# Patient Record
Sex: Female | Born: 1995
Health system: Southern US, Community
[De-identification: ages and names within clinical notes are randomized; demographics above are authoritative.]

## PROBLEM LIST (undated history)

## (undated) DIAGNOSIS — G43909 Migraine, unspecified, not intractable, without status migrainosus: Secondary | ICD-10-CM

---
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
## 2016-01-28 MED FILL — SUMATRIPTAN SUCC 50 MG TAB: 50 | 30 days supply | Qty: 9 | Fill #3

## 2016-01-30 ENCOUNTER — Emergency Department (INDEPENDENT_AMBULATORY_CARE_PROVIDER_SITE_OTHER): Payer: 59

## 2016-01-30 ENCOUNTER — Emergency Department
Admission: EM | Admit: 2016-01-30 | Discharge: 2016-01-30 | Disposition: A | Discharge status: Home / Self Care | Payer: 59 | Source: Home / Self Care | Attending: Family Medicine | Admitting: Family Medicine

## 2016-01-30 ENCOUNTER — Encounter: Payer: Self-pay | Admitting: Emergency Medicine

## 2016-01-30 DIAGNOSIS — M25531 Pain in right wrist: Secondary | ICD-10-CM

## 2016-01-30 DIAGNOSIS — W19XXXA Unspecified fall, initial encounter: Secondary | ICD-10-CM | POA: Diagnosis not present

## 2016-01-30 DIAGNOSIS — S63501A Unspecified sprain of right wrist, initial encounter: Secondary | ICD-10-CM | POA: Diagnosis not present

## 2016-01-30 DIAGNOSIS — S6991XA Unspecified injury of right wrist, hand and finger(s), initial encounter: Secondary | ICD-10-CM | POA: Diagnosis not present

## 2016-01-30 NOTE — Discharge Instructions (Signed)
Apply ice pack for 30 minutes every 2 to 3 hours today and tomorrow.  Elevate. Wear brace for about 10 to 14 days.  Begin range of motion and stretching exercises as tolerated.  For pain may take Ibuprofen 200mg , 3 or 4 tabs every 8 hours with food.

## 2016-01-30 NOTE — ED Notes (Signed)
Right wrist injury, fell down steps today

## 2016-01-30 NOTE — ED Provider Notes (Signed)
CSN: [id number]     Arrival date & time 01/30/16  1502 History   First MD Initiated Contact with Patient 01/30/16 1608     Chief Complaint  Patient presents with  . Wrist Injury      HPI Comments: Patient fell down several steps two hours ago, bracing herself with her right hand/wrist.  She complains of pain in her right wrist.  Patient is a 20 y.o. female presenting with wrist pain. The history is provided by the patient.  Wrist Pain This is a new problem. Episode onset: 2 hours ago. The problem occurs constantly. The problem has not changed since onset.Associated symptoms comments: None . Exacerbated by: right wrist movement. Nothing relieves the symptoms. She has tried nothing for the symptoms.    Past Medical History  Diagnosis Date  . Migraines    History reviewed. No pertinent past surgical history. Family History  Problem Relation Age of Onset  . Hyperlipidemia Mother   . Hypertension Mother   . Diabetes      grandfather   . Skin cancer      grandfather    Social History  Substance Use Topics  . Smoking status: Current Every Day Smoker -- 1.00 packs/day    Types: Cigarettes  . Smokeless tobacco: Never Used  . Alcohol Use: No   OB History    No data available     Review of Systems  All other systems reviewed and are negative.   Allergies  Review of patient's allergies indicates no known allergies.  Home Medications   Prior to Admission medications   Medication Sig Start Date End Date Taking? Authorizing Provider  escitalopram (LEXAPRO) 10 MG tablet Take 1 tablet (10 mg total) by mouth daily. 04/03/15   Laren BoomSean Hommel, DO  hydrOXYzine (ATARAX/VISTARIL) 25 MG tablet Take 1 tablet (25 mg total) by mouth 2 (two) times daily as needed for anxiety. 04/03/15   Sean Hommel, DO  pantoprazole (PROTONIX) 40 MG tablet Take 1 tablet (40 mg total) by mouth daily. May stop in March 2016 11/12/14   Laren BoomSean Hommel, DO  SUMAtriptan (IMITREX) 50 MG tablet Take 1 tablet (50 mg total)  by mouth every 2 (two) hours as needed for migraine or headache. 09/24/15   Laren BoomSean Hommel, DO   Meds Ordered and Administered this Visit  Medications - No data to display  BP 123/84 mmHg  Pulse 88  Temp(Src) 98.4 F (36.9 C) (Oral)  Ht 5\' 3"  (1.6 m)  Wt 141 lb (63.957 kg)  BMI 24.98 kg/m2  SpO2 96%  LMP 01/24/2016 No data found.   Physical Exam  Constitutional: She is oriented to person, place, and time. She appears well-developed and well-nourished. No distress.  HENT:  Head: Atraumatic.  Eyes: Conjunctivae are normal. Pupils are equal, round, and reactive to light.  Neck: Normal range of motion.  Pulmonary/Chest: Effort normal.  Musculoskeletal:       Right wrist: She exhibits decreased range of motion, tenderness and bony tenderness. She exhibits no swelling, no effusion, no crepitus, no deformity and no laceration.       Hands: Right wrist has tenderness to palpation on there ulnar aspect as noted on diagram.      Neurological: She is alert and oriented to person, place, and time.  Skin: Skin is warm and dry.  Nursing note and vitals reviewed.   ED Course  Procedures none   Imaging Review Dg Wrist Complete Right  01/30/2016  CLINICAL DATA:  Pain following fall EXAM:  RIGHT WRIST - COMPLETE 3+ VIEW COMPARISON:  None. FINDINGS: Frontal, oblique, lateral, and ulnar deviation scaphoid images were obtained. There is no appreciable fracture or dislocation. The joint spaces appear normal. No erosive change. IMPRESSION: No fracture or dislocation.  No appreciable arthropathy. Electronically Signed   By: Bretta BangWilliam  Woodruff III M.D.   On: 01/30/2016 16:10      MDM   1. Sprain of right wrist, initial encounter    Concern for possible TFCC injury.  Wrist splint applied. Apply ice pack for 30 minutes every 2 to 3 hours today and tomorrow.  Elevate. Wear brace for about 10 to 14 days.  Begin range of motion and stretching exercises as tolerated.  For pain may take Ibuprofen 200mg , 3  or 4 tabs every 8 hours with food.  Followup with Dr. Rodney Langtonhomas Thekkekandam or Dr. Clementeen GrahamEvan Corey ([facility]) if not improving about two weeks.     Lattie HawStephen A Beese, MD 02/01/16 [phone]

## 2016-02-24 ENCOUNTER — Encounter: Payer: 59 | Admitting: Obstetrics & Gynecology

## 2016-03-24 MED FILL — SUMATRIPTAN SUCC 50 MG TAB: 50 | 30 days supply | Qty: 9 | Fill #4

## 2016-04-20 MED FILL — SUMATRIPTAN SUCC 50 MG TAB: 50 | 30 days supply | Qty: 9 | Fill #5

## 2016-06-04 MED FILL — SUMATRIPTAN SUCC 50 MG TAB: 50 | 30 days supply | Qty: 9 | Fill #6

## 2016-06-09 ENCOUNTER — Ambulatory Visit (INDEPENDENT_AMBULATORY_CARE_PROVIDER_SITE_OTHER): Payer: 59 | Admitting: Physician Assistant

## 2016-06-09 ENCOUNTER — Encounter: Payer: Self-pay | Admitting: Physician Assistant

## 2016-06-09 VITALS — BP 116/57 | HR 84 | Ht 63.0 in | Wt 149.0 lb

## 2016-06-09 DIAGNOSIS — Z7251 High risk heterosexual behavior: Secondary | ICD-10-CM | POA: Diagnosis not present

## 2016-06-09 DIAGNOSIS — R319 Hematuria, unspecified: Secondary | ICD-10-CM | POA: Diagnosis not present

## 2016-06-09 DIAGNOSIS — N3001 Acute cystitis with hematuria: Secondary | ICD-10-CM

## 2016-06-09 DIAGNOSIS — R3915 Urgency of urination: Secondary | ICD-10-CM

## 2016-06-09 DIAGNOSIS — R8290 Unspecified abnormal findings in urine: Secondary | ICD-10-CM

## 2016-06-09 DIAGNOSIS — R300 Dysuria: Secondary | ICD-10-CM

## 2016-06-09 LAB — POCT URINALYSIS DIPSTICK
BILIRUBIN UA: NEGATIVE
GLUCOSE UA: NEGATIVE
KETONES UA: NEGATIVE
Nitrite, UA: NEGATIVE
Spec Grav, UA: 1.02
Urobilinogen, UA: 0.2
pH, UA: 7

## 2016-06-09 LAB — POCT URINE PREGNANCY: Preg Test, Ur: NEGATIVE

## 2016-06-09 MED ORDER — CIPROFLOXACIN HCL 500 MG PO TABS: 500.0000 mg | ORAL_TABLET | Freq: Two times a day (BID) | ORAL | 0 refills | Status: DC

## 2016-06-09 MED ORDER — ETONOGESTREL-ETHINYL ESTRADIOL 0.12-0.015 MG/24HR VA RING: VAGINAL_RING | VAGINAL | 12 refills | Status: DC

## 2016-06-09 MED FILL — CIPROFLOXACIN HCL 500 MG TA: 500 | 7 days supply | Qty: 14 | Fill #0

## 2016-06-09 MED FILL — NUVARING VAGINAL RING: 0.12-0.015 | 84 days supply | Qty: 3 | Fill #0

## 2016-06-09 NOTE — Patient Instructions (Signed)

## 2016-06-09 NOTE — Progress Notes (Signed)
   Subjective:    Patient ID: Mckenzie Stevens, female    DOB: 09-24-1995, 20 y.o.   MRN: [id number]  HPI Pt is a 20 yo female who presents to the clinic with dysuria, urgency, abnormal urine odor for 7 days. She is having some right sided episodic shooting pain. No fever or chills. At times she feels a little nauseated. No abdominal pain. She has not tried anything to make better. No vaginal discharge. She is not using anything to prevent pregnancy or protect against STD.    Review of Systems  All other systems reviewed and are negative.      Objective:   Physical Exam  Constitutional: She is oriented to person, place, and time. She appears well-developed and well-nourished.  HENT:  Head: Normocephalic and atraumatic.  Cardiovascular: Normal rate, regular rhythm and normal heart sounds.   Pulmonary/Chest: Effort normal and breath sounds normal.  No CVA tenderness.   Abdominal: Soft. Bowel sounds are normal. She exhibits no distension and no mass. There is no tenderness. There is no rebound and no guarding.  Neurological: She is alert and oriented to person, place, and time.  Psychiatric: She has a normal mood and affect. Her behavior is normal.          Assessment & Plan:  .Marland Kitchen.Mckenzie Stevens was seen today for dysuria and abnormal urine odor.  Diagnoses and all orders for this visit:  Acute cystitis with hematuria -     ciprofloxacin (CIPRO) 500 MG tablet; Take 1 tablet (500 mg total) by mouth 2 (two) times daily. For 7 days.  Dysuria -     POCT urinalysis dipstick -     Urine Culture  Urinary urgency -     POCT urinalysis dipstick -     Urine Culture  Abnormal urine odor -     POCT urinalysis dipstick -     Urine Culture  Hematuria, unspecified type -     Urine Culture  Unprotected sex -     etonogestrel-ethinyl estradiol (NUVARING) 0.12-0.015 MG/24HR vaginal ring; Insert vaginally and leave in place for 3 consecutive weeks, then remove for 1 week. -     POCT urine  pregnancy   UPT was negative.  Pt declined STD testing.   .. Results for orders placed or performed in visit on 06/09/16  POCT urinalysis dipstick  Result Value Ref Range   Color, UA yellow    Clarity, UA cloudy    Glucose, UA neg    Bilirubin, UA neg    Ketones, UA neg    Spec Grav, UA 1.020    Blood, UA trace-lysed    pH, UA 7.0    Protein, UA trace    Urobilinogen, UA 0.2    Nitrite, UA neg    Leukocytes, UA large (3+) (A) Negative  POCT urine pregnancy  Result Value Ref Range   Preg Test, Ur Negative Negative   Will culture.   Treated for UTI. Symptomatic care discussed.   Started nuva ring after discussing birth control options. Encouraged condoms for STD prevention.

## 2016-06-11 LAB — URINE CULTURE: Organism ID, Bacteria: NO GROWTH

## 2016-07-25 IMAGING — DX DG WRIST COMPLETE 3+V*R*
4 series · 4 of 4 positions shown · non-contrast
Comparison: None.

CLINICAL DATA: Pain following fall

EXAM:
RIGHT WRIST - COMPLETE 3+ VIEW

[wrist pa]
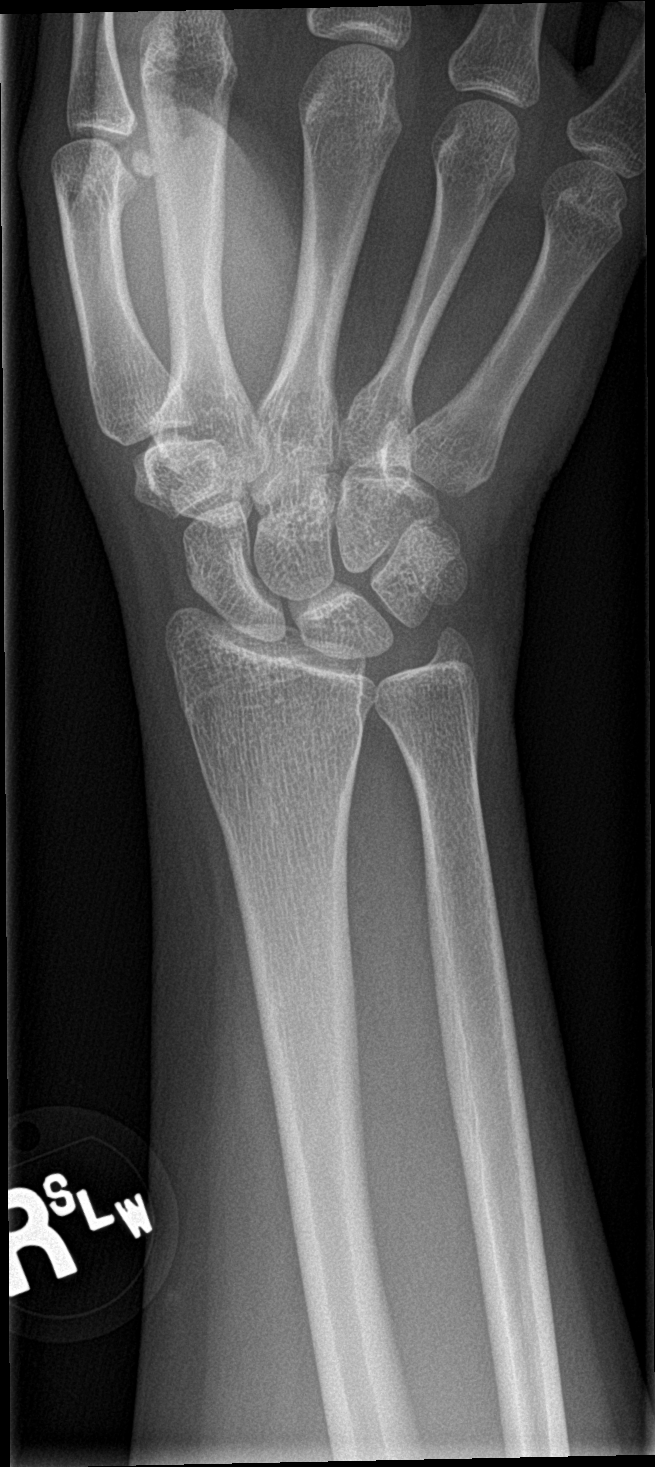

[wrist obl]
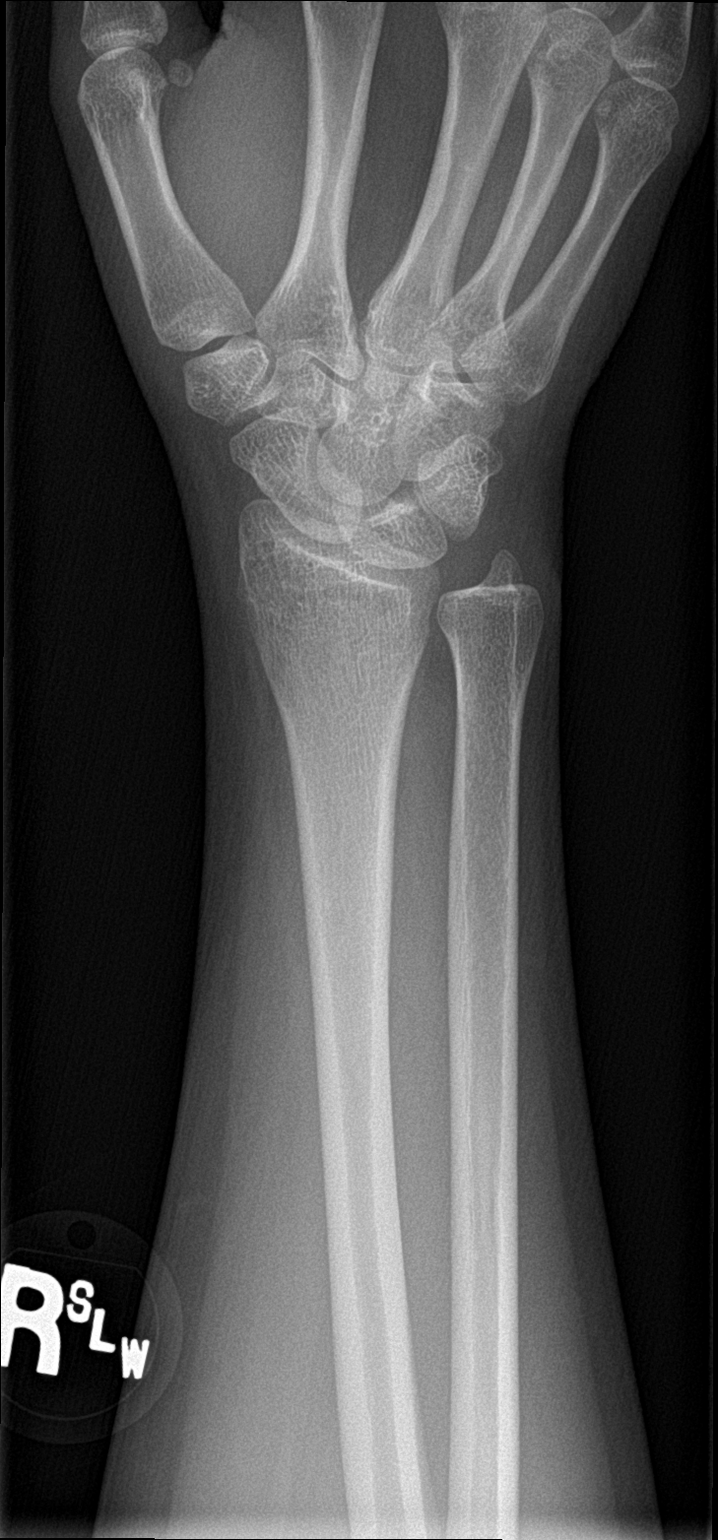

[wrist lat]
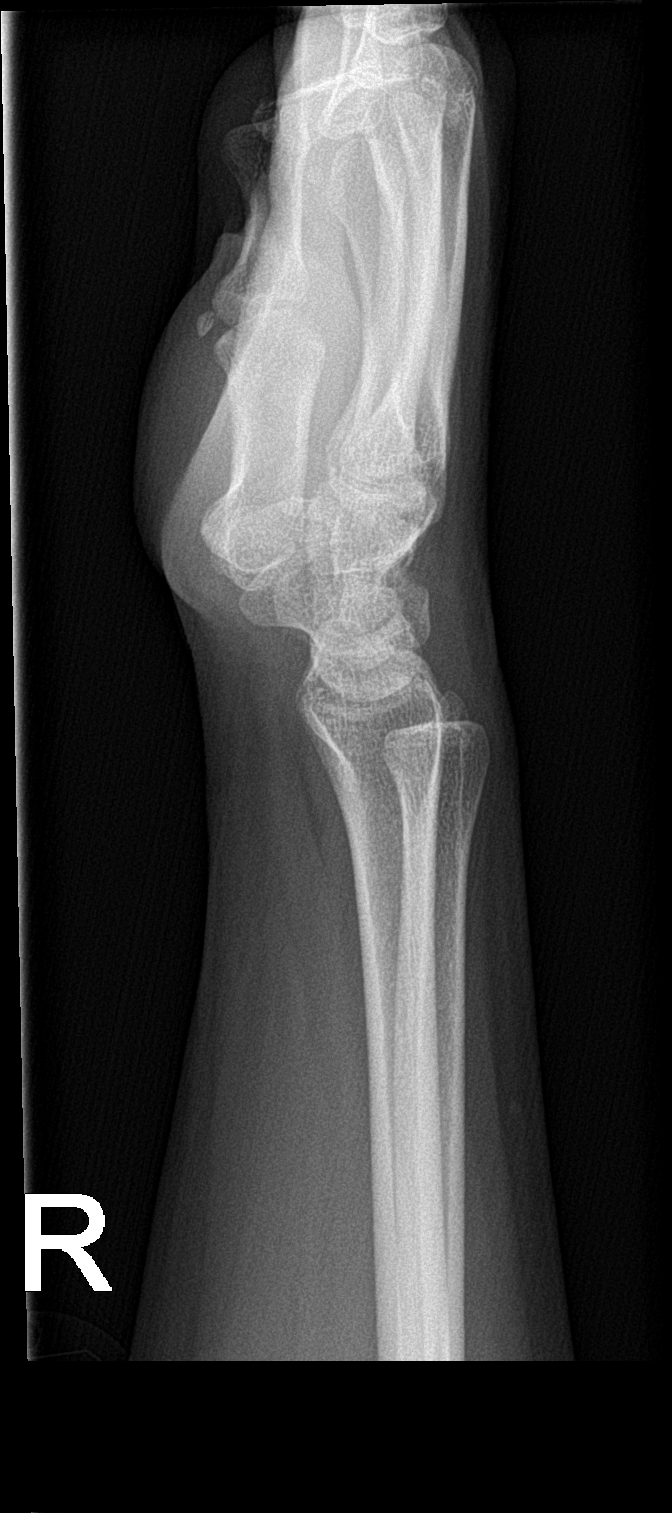

[wrist navicular]
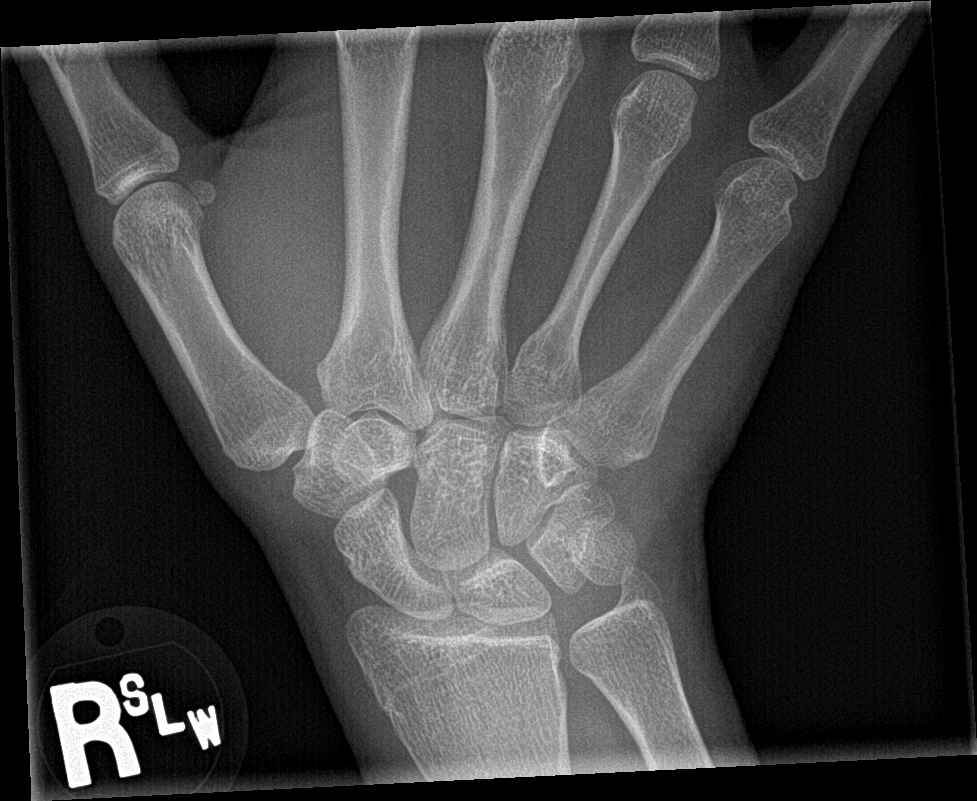

[4 of 4 positions shown; findings below may reference images not displayed]

FINDINGS: Frontal, oblique, lateral, and ulnar deviation scaphoid images were
obtained. There is no appreciable fracture or dislocation. The joint
spaces appear normal. No erosive change.
IMPRESSION: No fracture or dislocation.  No appreciable arthropathy.

## 2016-08-10 ENCOUNTER — Telehealth (INDEPENDENT_AMBULATORY_CARE_PROVIDER_SITE_OTHER): Payer: Self-pay | Admitting: Pediatrics

## 2016-08-10 NOTE — Telephone Encounter (Signed)
This note was opened in error.

## 2016-08-31 MED FILL — SUMATRIPTAN SUCC 50 MG TAB: 50 | 30 days supply | Qty: 9 | Fill #7

## 2016-11-17 ENCOUNTER — Encounter: Payer: Self-pay | Admitting: Physician Assistant

## 2016-11-17 ENCOUNTER — Other Ambulatory Visit: Payer: Self-pay | Admitting: *Deleted

## 2016-11-17 MED ORDER — SUMATRIPTAN SUCCINATE 50 MG PO TABS: 50.0000 mg | ORAL_TABLET | ORAL | 11 refills | Status: DC | PRN

## 2016-11-17 MED FILL — SUMATRIPTAN SUCC 50 MG TAB: 50 | 30 days supply | Qty: 9 | Fill #0

## 2016-11-25 ENCOUNTER — Ambulatory Visit: Payer: 59 | Admitting: Physician Assistant

## 2017-01-03 ENCOUNTER — Encounter: Payer: Self-pay | Admitting: Emergency Medicine

## 2017-01-03 ENCOUNTER — Emergency Department
Admission: EM | Admit: 2017-01-03 | Discharge: 2017-01-03 | Disposition: A | Discharge status: Home / Self Care | Payer: 59 | Source: Home / Self Care | Attending: Family Medicine | Admitting: Family Medicine

## 2017-01-03 DIAGNOSIS — L03011 Cellulitis of right finger: Secondary | ICD-10-CM | POA: Diagnosis not present

## 2017-01-03 MED ORDER — CEPHALEXIN 500 MG PO CAPS: 500.0000 mg | ORAL_CAPSULE | Freq: Three times a day (TID) | ORAL | 0 refills | Status: DC

## 2017-01-03 MED ORDER — ACETAMINOPHEN 500 MG PO TABS
1000.0000 mg | ORAL_TABLET | Freq: Once | ORAL | Status: AC
  Administered 2017-01-03: 1000 mg via ORAL

## 2017-01-03 MED ORDER — MUPIROCIN 2 % EX OINT: TOPICAL_OINTMENT | CUTANEOUS | 0 refills | Status: DC

## 2017-01-03 MED ORDER — TRAMADOL HCL 50 MG PO TABS: 50.0000 mg | ORAL_TABLET | Freq: Four times a day (QID) | ORAL | 0 refills | Status: DC | PRN

## 2017-01-03 NOTE — ED Triage Notes (Signed)
[facility] and edema in the right middle finger. Pain 6/10 edema present after biting nails.Issue times 6/7 days

## 2017-01-03 NOTE — Discharge Instructions (Signed)
°  Tramadol is strong pain medication. While taking, do not drink alcohol, drive, or perform any other activities that requires focus while taking these medications.  ° °

## 2017-01-03 NOTE — ED Provider Notes (Signed)
CSN: [id number]     Arrival date & time 01/03/17  1114 History   First MD Initiated Contact with Patient 01/03/17 1136     Chief Complaint  Patient presents with  . Hand Pain   (Consider location/radiation/quality/duration/timing/severity/associated sxs/prior Treatment) HPI  Mckenzie Stevens is a 21 y.o. female presenting to UC with c/o gradually worsening Right middle finger pain, swelling, and redness around her fingernail for about 6 days.  Pain is aching and throbbing, worse with palpation and movement, 6/10.  Mckenzie Stevens notes Mckenzie Stevens does bite her nails on occasion.  Denies fever, chills, n/v/d. Mckenzie Stevens is Right hand dominant.    Past Medical History:  Diagnosis Date  . Migraines    History reviewed. No pertinent surgical history. Family History  Problem Relation Age of Onset  . Hyperlipidemia Mother   . Hypertension Mother   . Diabetes Unknown        grandfather   . Skin cancer Unknown        grandfather    Social History  Substance Use Topics  . Smoking status: Current Every Day Smoker    Packs/day: 1.00    Types: Cigarettes  . Smokeless tobacco: Current User  . Alcohol use No   OB History    No data available     Review of Systems  Constitutional: Negative for chills and fever.  Gastrointestinal: Negative for nausea and vomiting.  Musculoskeletal: Positive for arthralgias and joint swelling.  Skin: Positive for color change and wound. Negative for rash.    Allergies  Patient has no known allergies.  Home Medications   Prior to Admission medications   Medication Sig Start Date End Date Taking? Authorizing Provider  cephALEXin (KEFLEX) 500 MG capsule Take 1 capsule (500 mg total) by mouth 3 (three) times daily. 01/03/17   Junius Finner, PA-C  ciprofloxacin (CIPRO) 500 MG tablet Take 1 tablet (500 mg total) by mouth 2 (two) times daily. For 7 days. 06/09/16   Breeback, Jade L, PA-C  escitalopram (LEXAPRO) 10 MG tablet Take 1 tablet (10 mg total) by mouth daily. 04/03/15   Laren Boom, DO  etonogestrel-ethinyl estradiol (NUVARING) 0.12-0.015 MG/24HR vaginal ring Insert vaginally and leave in place for 3 consecutive weeks, then remove for 1 week. 06/09/16   Breeback, Jade L, PA-C  hydrOXYzine (ATARAX/VISTARIL) 25 MG tablet Take 1 tablet (25 mg total) by mouth 2 (two) times daily as needed for anxiety. 04/03/15   Laren Boom, DO  mupirocin ointment (BACTROBAN) 2 % Apply to finger twice daily for 5-7 days 01/03/17   Junius Finner, PA-C  pantoprazole (PROTONIX) 40 MG tablet Take 1 tablet (40 mg total) by mouth daily. May stop in March 2016 11/12/14   Laren Boom, DO  SUMAtriptan (IMITREX) 50 MG tablet Take 1 tablet (50 mg total) by mouth every 2 (two) hours as needed for migraine or headache. 11/17/16   Tandy Gaw L, PA-C  traMADol (ULTRAM) 50 MG tablet Take 1 tablet (50 mg total) by mouth every 6 (six) hours as needed. 01/03/17   Junius Finner, PA-C   Meds Ordered and Administered this Visit  Medications - No data to display  BP 126/86 (BP Location: Right Arm)   Pulse 90   Temp 97.9 F (36.6 C) (Oral)   Resp 16   Ht 5\' 3"  (1.6 m)   Wt 161 lb 8 oz (73.3 kg)   LMP 12/26/2016   SpO2 98%   BMI 28.61 kg/m  No data found.   Physical Exam  Constitutional: Mckenzie Stevens is oriented to person, place, and time. Mckenzie Stevens appears well-developed and well-nourished.  HENT:  Head: Normocephalic and atraumatic.  Eyes: EOM are normal.  Neck: Normal range of motion.  Cardiovascular: Normal rate.   Pulmonary/Chest: Effort normal.  Musculoskeletal: Normal range of motion. Mckenzie Stevens exhibits edema and tenderness.  Right middle finger: mild to moderate edema to radial aspect. Tender. Slight decreased flexion due to swelling.  Neurological: Mckenzie Stevens is alert and oriented to person, place, and time.  Skin: Skin is warm and dry. Capillary refill takes less than 2 seconds. There is erythema.  Paronychia of Right middle finger, radial aspect. Fluctuance. No active drainage.   Psychiatric: Mckenzie Stevens has a normal  mood and affect. Her behavior is normal.  Nursing note and vitals reviewed.   Urgent Care Course     .Marland Kitchen.Incision and Drainage Date/Time: 01/03/2017 11:54 AM Performed by: Junius Finner'MALLEY, Rhyan Radler Authorized by: Donna ChristenBEESE, STEPHEN A   Consent:    Consent obtained:  Verbal   Consent given by:  Patient   Risks discussed:  Bleeding, incomplete drainage, infection and pain   Alternatives discussed:  Delayed treatment and alternative treatment Location:    Type:  Abscess   Size:  0.5   Location:  Upper extremity   Upper extremity location:  Finger   Finger location:  R long finger Pre-procedure details:    Skin preparation:  Betadine Anesthesia (see MAR for exact dosages):    Anesthesia method:  Topical application and local infiltration   Topical anesthesia: freeze spray.   Local anesthetic:  Lidocaine 1% w/o epi Procedure type:    Complexity:  Simple Procedure details:    Needle aspiration: no     Incision types:  Single straight   Incision depth:  Subcutaneous   Scalpel blade:  11   Wound management:  Irrigated with saline   Drainage:  Purulent and bloody   Drainage amount:  Moderate   Wound treatment: bacitracin and bandage.   Packing materials:  None Post-procedure details:    Patient tolerance of procedure:  Tolerated well, no immediate complications   (including critical care time)  Labs Review Labs Reviewed - No data to display  Imaging Review No results found.    MDM   1. Paronychia of right middle finger    I&D of paronychia w/o complication.  Rx: Tramadol, Mupirocin ointment, and Keflex Home care instructions provided. May soak finger in warm water and epson salt 2-3 times daily. Discouraged pt from biting nails. F/u in 4-5 days if needed, sooner if worsening.     Junius FinnerO'Malley, Namiyah Grantham, PA-C 01/03/17 1202

## 2017-01-29 MED FILL — SUMATRIPTAN SUCC 50 MG TAB: 50 | 30 days supply | Qty: 9 | Fill #1

## 2017-05-11 MED FILL — SUMATRIPTAN SUCC 50 MG TABL: 50 | 30 days supply | Qty: 9 | Fill #2

## 2017-08-13 ENCOUNTER — Encounter: Payer: Self-pay | Admitting: Physician Assistant

## 2017-08-13 ENCOUNTER — Ambulatory Visit (INDEPENDENT_AMBULATORY_CARE_PROVIDER_SITE_OTHER): Payer: No Typology Code available for payment source | Admitting: Physician Assistant

## 2017-08-13 VITALS — BP 120/70 | HR 72 | Wt 156.0 lb

## 2017-08-13 DIAGNOSIS — F329 Major depressive disorder, single episode, unspecified: Secondary | ICD-10-CM | POA: Diagnosis not present

## 2017-08-13 DIAGNOSIS — F411 Generalized anxiety disorder: Secondary | ICD-10-CM

## 2017-08-13 DIAGNOSIS — R519 Headache, unspecified: Secondary | ICD-10-CM

## 2017-08-13 DIAGNOSIS — R51 Headache: Secondary | ICD-10-CM

## 2017-08-13 DIAGNOSIS — F419 Anxiety disorder, unspecified: Secondary | ICD-10-CM | POA: Diagnosis not present

## 2017-08-13 DIAGNOSIS — N946 Dysmenorrhea, unspecified: Secondary | ICD-10-CM

## 2017-08-13 MED ORDER — HYDROXYZINE HCL 25 MG PO TABS: 25.0000 mg | ORAL_TABLET | Freq: Two times a day (BID) | ORAL | 5 refills | Status: AC | PRN

## 2017-08-13 MED ORDER — BUTALBITAL-ASPIRIN-CAFFEINE 50-325-40 MG PO CAPS: 1.0000 | ORAL_CAPSULE | Freq: Two times a day (BID) | ORAL | 0 refills | Status: DC | PRN

## 2017-08-13 MED ORDER — CITALOPRAM HYDROBROMIDE 10 MG PO TABS: 10.0000 mg | ORAL_TABLET | Freq: Every day | ORAL | 1 refills | Status: DC

## 2017-08-13 MED FILL — BUTALBITAL-ASA-CAFFEINE CAP: 50-325-40 | 7 days supply | Qty: 14 | Fill #0

## 2017-08-13 MED FILL — CITALOPRAM HBR 10 MG TABLET: 10 | 30 days supply | Qty: 30 | Fill #0

## 2017-08-13 MED FILL — hydrOXYzine HCL 25 MG TABS: 25 | 15 days supply | Qty: 30 | Fill #0

## 2017-08-14 ENCOUNTER — Encounter: Payer: Self-pay | Admitting: Physician Assistant

## 2017-08-14 DIAGNOSIS — R519 Headache, unspecified: Secondary | ICD-10-CM | POA: Insufficient documentation

## 2017-08-14 DIAGNOSIS — N946 Dysmenorrhea, unspecified: Secondary | ICD-10-CM | POA: Insufficient documentation

## 2017-08-14 DIAGNOSIS — F419 Anxiety disorder, unspecified: Principal | ICD-10-CM | POA: Insufficient documentation

## 2017-08-14 DIAGNOSIS — R51 Headache: Secondary | ICD-10-CM

## 2017-08-14 DIAGNOSIS — F329 Major depressive disorder, single episode, unspecified: Secondary | ICD-10-CM | POA: Insufficient documentation

## 2017-08-14 DIAGNOSIS — F411 Generalized anxiety disorder: Secondary | ICD-10-CM | POA: Insufficient documentation

## 2017-08-14 NOTE — Progress Notes (Signed)
Subjective:    Patient ID: Mckenzie Stevens, female    DOB: [birth year], 22 y.o.   MRN: [id number]  HPI Pt is a 22 yo pleasant female who presents to the clinic with her mother to discuss anxiety and depression. She has been struggling with anxiety and depression for a while. A few years ago she was started on lexapro. She doesn't remember if she didn't give it time to work or if she didn't like it. She denies any SI/HC thoughts. She is struggling a lot with what she wants to do with her life. Right now she hates her job but has to work all the time. 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 She finds herself without motivation. She also gets overwhelmed easily. hydroxyzine has helped in the past for acute anxiety. She has been to counseling but got to a point where she did not feel like it was helping.   She is also having more headaches. excedrin migraine does help rescue. She is taking a lot of NSAIDs. She denies any photo/phono phobia. No n/v. Headaches is mostly dull and back to top of head. Most of her HA's are during her period. She cannot tolerate OCP. She also c/o of very painful bad cramping.   .. Active Ambulatory Problems    Diagnosis Date Noted  . Migraines   . Recurrent UTI 06/21/2014  . Constipation 08/01/2014  . Gastroesophageal reflux disease with esophagitis 08/01/2014   Resolved Ambulatory Problems    Diagnosis Date Noted  . No Resolved Ambulatory Problems   Past Medical History:  Diagnosis Date  . Migraines       Review of Systems  All other systems reviewed and are negative.      Objective:   Physical Exam  Constitutional: She is oriented to person, place, and time. She appears well-developed and well-nourished.  HENT:  Head: Normocephalic and atraumatic.  Neck: Normal range of motion. Neck supple. No thyromegaly present.  Cardiovascular: Normal rate, regular rhythm and normal heart sounds.  Pulmonary/Chest: Effort normal and  breath sounds normal.  Neurological: She is alert and oriented to person, place, and time.  Psychiatric:  Tearful.           Assessment & Plan:  .[name] was seen today for anxiety and depression.  Diagnoses and all orders for this visit:  Anxiety and depression -     citalopram (CELEXA) 10 MG tablet; Take 1 tablet (10 mg total) by mouth daily.  Anxiety state -     citalopram (CELEXA) 10 MG tablet; Take 1 tablet (10 mg total) by mouth daily. -     hydrOXYzine (ATARAX/VISTARIL) 25 MG tablet; Take 1 tablet (25 mg total) by mouth 2 (two) times daily as needed for anxiety.  Frequent headaches -     butalbital-aspirin-caffeine (FIORINAL) 50-325-40 MG capsule; Take 1 capsule by mouth 2 (two) times daily as needed for headache.       .. Depression screen PHQ 2/9 08/13/2017  Decreased Interest 2  Down, Depressed, Hopeless 2  PHQ - 2 Score 4  Altered sleeping 3  Tired, decreased energy 3  Change in appetite 3  Feeling bad or failure about yourself  3  Trouble concentrating 0  Moving slowly or fidgety/restless 0  Suicidal thoughts 0  PHQ-9 Score 16  Difficult doing work/chores Somewhat difficult   .Marland Kitchen GAD 7 : Generalized Anxiety Score 08/13/2017  Nervous, Anxious, on Edge 1  Control/stop worrying 2  Worry too  much - different things 1  Trouble relaxing 2  Restless 0  Easily annoyed or irritable 3  Afraid - awful might happen 2  Total GAD 7 Score 11  Anxiety Difficulty Somewhat difficult    Start celexa. hydroxyzine for as needed anxiety. Discussed side effects.  Continue with counseling to try and focus on where she sees her life going.  Encouraged exercise.  Likely headaches are more stress/tension related. Work on stress relief. Consider massage.  Given rescue fiornal to use as needed.  Consider GYN visit to rule out endometriosis.  Follow up in 6 weeks.   Marland Kitchen..Spent 30 minutes with patient and greater than 50 percent of visit spent counseling patient regarding  treatment plan.

## 2017-09-06 ENCOUNTER — Ambulatory Visit (INDEPENDENT_AMBULATORY_CARE_PROVIDER_SITE_OTHER): Payer: No Typology Code available for payment source | Admitting: Physician Assistant

## 2017-09-06 VITALS — BP 118/90 | HR 82 | Ht 63.0 in | Wt 150.0 lb

## 2017-09-06 DIAGNOSIS — F329 Major depressive disorder, single episode, unspecified: Secondary | ICD-10-CM | POA: Diagnosis not present

## 2017-09-06 DIAGNOSIS — F419 Anxiety disorder, unspecified: Secondary | ICD-10-CM | POA: Diagnosis not present

## 2017-09-06 DIAGNOSIS — Z79899 Other long term (current) drug therapy: Secondary | ICD-10-CM | POA: Diagnosis not present

## 2017-09-06 MED ORDER — CITALOPRAM HYDROBROMIDE 20 MG PO TABS: 20.0000 mg | ORAL_TABLET | Freq: Every day | ORAL | 1 refills | Status: AC

## 2017-09-06 MED FILL — CITALOPRAM HBR 20 MG TABLET: 20 | 90 days supply | Qty: 90 | Fill #0

## 2017-09-06 NOTE — Progress Notes (Signed)
   Subjective:    Patient ID: Mckenzie Stevens, female    DOB: 1995/11/10, 22 y.o.   MRN: [id number]  HPI Pt is a 22 yo female who presents to the clinic to follow up after starting celexa. She is doing much better on celexa. She feels much more calm and happy. She rarely has used vistaril. She denies any side effects of medication. She denies any SI/HC. She got a new job and that is helping with her mood. She is sleeping well.  She is still not quite to where she would like to be. 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   .. Active Ambulatory Problems    Diagnosis Date Noted  . Migraines   . Recurrent UTI 06/21/2014  . Constipation 08/01/2014  . Gastroesophageal reflux disease with esophagitis 08/01/2014  . Dysmenorrhea 08/14/2017  . Frequent headaches 08/14/2017  . Anxiety state 08/14/2017  . Anxiety and depression 08/14/2017   Resolved Ambulatory Problems    Diagnosis Date Noted  . No Resolved Ambulatory Problems   Past Medical History:  Diagnosis Date  . Migraines       Review of Systems  All other systems reviewed and are negative.      Objective:   Physical Exam  Constitutional: She is oriented to person, place, and time. She appears well-developed and well-nourished.  HENT:  Head: Normocephalic and atraumatic.  Cardiovascular: Normal rate, regular rhythm and normal heart sounds.  Pulmonary/Chest: Effort normal and breath sounds normal.  Neurological: She is alert and oriented to person, place, and time.  Psychiatric: She has a normal mood and affect. Her behavior is normal.          Assessment & Plan:  .Marland Kitchen.Mckenzie Stevens was seen today for anxiety and depression.  Diagnoses and all orders for this visit:  Anxiety and depression -     citalopram (CELEXA) 20 MG tablet; Take 1 tablet (20 mg total) by mouth daily.  Medication management -     COMPLETE METABOLIC PANEL WITH GFR     .Marland Kitchen. Depression screen [facility]HQ 2/9  09/06/2017 08/13/2017  Decreased Interest 2 2  Down, Depressed, Hopeless 1 2  PHQ - 2 Score 3 4  Altered sleeping 2 3  Tired, decreased energy 2 3  Change in appetite 3 3  Feeling bad or failure about yourself  1 3  Trouble concentrating 0 0  Moving slowly or fidgety/restless 0 0  Suicidal thoughts 0 0  PHQ-9 Score 11 16  Difficult doing work/chores - Somewhat difficult   .Marland Kitchen. GAD 7 : Generalized Anxiety Score 09/06/2017 08/13/2017  Nervous, Anxious, on Edge 1 1  Control/stop worrying 1 2  Worry too much - different things 1 1  Trouble relaxing 2 2  Restless 0 0  Easily annoyed or irritable 2 3  Afraid - awful might happen 1 2  Total GAD 7 Score 8 11  Anxiety Difficulty - Somewhat difficult    CMP ordered to watch AE of medication. Increased celexa to 20mg  daily. Follow up in 6 months. Discussed ways to get started on education and working toward bigger goals. Strongly encouraged counseling.

## 2017-09-08 ENCOUNTER — Other Ambulatory Visit: Payer: Self-pay

## 2017-09-08 ENCOUNTER — Encounter: Payer: Self-pay | Admitting: *Deleted

## 2017-09-08 ENCOUNTER — Emergency Department
Admission: EM | Admit: 2017-09-08 | Discharge: 2017-09-08 | Disposition: A | Discharge status: Home / Self Care | Payer: No Typology Code available for payment source | Source: Home / Self Care | Attending: Family Medicine | Admitting: Family Medicine

## 2017-09-08 DIAGNOSIS — R112 Nausea with vomiting, unspecified: Secondary | ICD-10-CM | POA: Diagnosis not present

## 2017-09-08 DIAGNOSIS — R197 Diarrhea, unspecified: Secondary | ICD-10-CM | POA: Diagnosis not present

## 2017-09-08 MED ORDER — ONDANSETRON 4 MG PO TBDP: ORAL_TABLET | ORAL | 0 refills | Status: DC

## 2017-09-08 MED FILL — ONDANSETRON ODT 4 MG TABLET: 4 | 3 days supply | Qty: 12 | Fill #0

## 2017-09-08 NOTE — Discharge Instructions (Signed)
Begin clear liquids (Pedialyte while having diarrhea) until improved, then advance to a BRAT diet (Bananas, Rice, Applesauce, Toast).  Then gradually resume a regular diet when tolerated.  Avoid milk products until well.  ° °If symptoms become significantly worse during the night or over the weekend, proceed to the local emergency room. ° °

## 2017-09-08 NOTE — ED Provider Notes (Signed)
[facility]    CSN: [id number] Arrival date & time: 09/08/17  1237     History   Chief Complaint Chief Complaint  Patient presents with  . Diarrhea    HPI Mckenzie Stevens is a 22 y.o. female.   Two days ago patient developed nausea without vomiting, watery diarrhea, dizziness, chills, myalgias, headache, and fatigue.  Her mother has similar symptoms.  No abdominal pain, and no fever.  Denies recent foreign travel, or drinking untreated water in a wilderness environment.  She denies recent antibiotic use.    The history is provided by the patient.  Diarrhea  Quality:  Watery Severity:  Moderate Onset quality:  Sudden Duration:  2 days Timing:  Intermittent Progression:  Improving Relieved by:  Nothing Worsened by:  Nothing Ineffective treatments:  None tried Associated symptoms: chills, diaphoresis, headaches and myalgias   Associated symptoms: no abdominal pain, no arthralgias, no recent cough, no fever, no URI and no vomiting   Risk factors: sick contacts   Risk factors: no recent antibiotic use, no suspicious food intake and no travel to endemic areas     Past Medical History:  Diagnosis Date  . Migraines     Patient Active Problem List   Diagnosis Date Noted  . Dysmenorrhea 08/14/2017  . Frequent headaches 08/14/2017  . Anxiety state 08/14/2017  . Anxiety and depression 08/14/2017  . Constipation 08/01/2014  . Gastroesophageal reflux disease with esophagitis 08/01/2014  . Recurrent UTI 06/21/2014  . Migraines     History reviewed. No pertinent surgical history.  OB History    No data available       Home Medications    Prior to Admission medications   Medication Sig Start Date End Date Taking? Authorizing Provider  citalopram (CELEXA) 20 MG tablet Take 1 tablet (20 mg total) by mouth daily. 09/06/17  Yes Breeback, Jade L, PA-C  hydrOXYzine (ATARAX/VISTARIL) 25 MG tablet Take 1 tablet (25 mg total) by mouth 2 (two) times daily as needed  for anxiety. 08/13/17  Yes Breeback, Jade L, PA-C  butalbital-aspirin-caffeine (FIORINAL) 50-325-40 MG capsule Take 1 capsule by mouth 2 (two) times daily as needed for headache. 08/13/17   Breeback, Jade L, PA-C  ondansetron (ZOFRAN ODT) 4 MG disintegrating tablet Take one tab by mouth Q6hr prn nausea.  Dissolve under tongue. 09/08/17   Lattie Haw, MD  SUMAtriptan (IMITREX) 50 MG tablet Take 1 tablet (50 mg total) by mouth every 2 (two) hours as needed for migraine or headache. 11/17/16   Jomarie Longs, PA-C    Family History Family History  Problem Relation Age of Onset  . Hyperlipidemia Mother   . Hypertension Mother   . Diabetes Unknown        grandfather   . Skin cancer Unknown        grandfather     Social History Social History   Tobacco Use  . Smoking status: Current Every Day Smoker    Packs/day: 0.25    Types: Cigarettes  . Smokeless tobacco: Current User  Substance Use Topics  . Alcohol use: No    Alcohol/week: 0.0 oz  . Drug use: No     Allergies   Patient has no known allergies.   Review of Systems Review of Systems  Constitutional: Positive for chills and diaphoresis. Negative for fever.  Gastrointestinal: Positive for diarrhea. Negative for abdominal pain and vomiting.  Musculoskeletal: Positive for myalgias. Negative for arthralgias.  Neurological: Positive for headaches.  All other  systems reviewed and are negative.    Physical Exam Triage Vital Signs ED Triage Vitals [09/08/17 1329]  Enc Vitals Group     BP 132/85     Pulse Rate 70     Resp 18     Temp 98.5 F (36.9 C)     Temp Source Oral     SpO2 99 %     Weight 149 lb (67.6 kg)     Height 5\' 3"  (1.6 m)     Head Circumference      Peak Flow      Pain Score 0     Pain Loc      Pain Edu?      Excl. in GC?    No data found.  Updated Vital Signs BP 132/85 (BP Location: Right Arm)   Pulse 70   Temp 98.5 F (36.9 C) (Oral)   Resp 18   Ht 5\' 3"  (1.6 m)   Wt 149 lb (67.6 kg)    LMP 08/09/2017   SpO2 99%   BMI 26.39 kg/m   Visual Acuity Right Eye Distance:   Left Eye Distance:   Bilateral Distance:    Right Eye Near:   Left Eye Near:    Bilateral Near:     Physical Exam Nursing notes and Vital Signs reviewed. Appearance:  Patient appears stated age, and in no acute distress.    Eyes:  Pupils are equal, round, and reactive to light and accomodation.  Extraocular movement is intact.  Conjunctivae are not inflamed   Pharynx:  Normal; moist mucous membranes  Neck:  Supple.  No adenopathy Lungs:  Clear to auscultation.  Breath sounds are equal.  Moving air well. Heart:  Regular rate and rhythm without murmurs, rubs, or gallops.  Abdomen:  Nontender without masses or hepatosplenomegaly.  Bowel sounds are present.  No CVA or flank tenderness.  Extremities:  No edema.  Skin:  No rash present.     UC Treatments / Results  Labs (all labs ordered are listed, but only abnormal results are displayed) Labs Reviewed - No data to display  EKG  EKG Interpretation None       Radiology No results found.  Procedures Procedures (including critical care time)  Medications Ordered in UC Medications - No data to display   Initial Impression / Assessment and Plan / UC Course  I have reviewed the triage vital signs and the nursing notes.  Pertinent labs & imaging results that were available during my care of the patient were reviewed by me and considered in my medical decision making (see chart for details).    Suspect viral gastroenteritis. Rx for Zofran ODT 4mg . Begin clear liquids (Pedialyte while having diarrhea) until improved, then advance to a SU[employer] diet (Bananas, Rice, Applesauce, Toast).  Then gradually resume a regular diet when tolerated.  Avoid milk products until well.     If symptoms become significantly worse during the night or over the weekend, proceed to the local emergency room.   Followup with Family Doctor if not improved in 5  days.  Final Clinical Impressions(s) / UC Diagnoses   Final diagnoses:  Nausea vomiting and diarrhea    ED Discharge Orders        Ordered    ondansetron (ZOFRAN ODT) 4 MG disintegrating tablet     09/08/17 1429          Lattie HawBeese, Laurelai Lepp A, MD 09/11/17 2033

## 2017-09-08 NOTE — ED Triage Notes (Signed)
Pt c/o diarrhea, hot and cold spells, dizziness and nausea x 2-3 days. Denies cough, fever or vomiting.

## 2017-09-09 ENCOUNTER — Encounter: Payer: Self-pay | Admitting: Physician Assistant

## 2017-09-10 ENCOUNTER — Encounter: Payer: Self-pay | Admitting: Physician Assistant

## 2017-09-10 DIAGNOSIS — R51 Headache: Principal | ICD-10-CM

## 2017-09-10 DIAGNOSIS — R519 Headache, unspecified: Secondary | ICD-10-CM

## 2017-09-13 MED ORDER — BUTALBITAL-ASPIRIN-CAFFEINE 50-325-40 MG PO CAPS: 1.0000 | ORAL_CAPSULE | Freq: Two times a day (BID) | ORAL | 1 refills | Status: AC | PRN

## 2017-09-23 MED FILL — SUMATRIPTAN SUCC 50 MG TAB: 50 | 30 days supply | Qty: 9 | Fill #3

## 2017-09-23 MED FILL — BUTALBITAL-ASA-CAFFEINE CAP: 50-325-40 | 7 days supply | Qty: 14 | Fill #0

## 2017-10-19 ENCOUNTER — Ambulatory Visit: Payer: No Typology Code available for payment source | Admitting: Physician Assistant

## 2017-10-19 DIAGNOSIS — Z0189 Encounter for other specified special examinations: Secondary | ICD-10-CM

## 2017-10-20 ENCOUNTER — Encounter: Payer: Self-pay | Admitting: Physician Assistant

## 2017-10-20 ENCOUNTER — Ambulatory Visit (INDEPENDENT_AMBULATORY_CARE_PROVIDER_SITE_OTHER): Payer: No Typology Code available for payment source | Admitting: Physician Assistant

## 2017-10-20 VITALS — BP 111/55 | HR 95 | Temp 98.6°F | Ht 63.0 in | Wt 159.0 lb

## 2017-10-20 DIAGNOSIS — J0120 Acute ethmoidal sinusitis, unspecified: Secondary | ICD-10-CM | POA: Diagnosis not present

## 2017-10-20 DIAGNOSIS — N946 Dysmenorrhea, unspecified: Secondary | ICD-10-CM | POA: Diagnosis not present

## 2017-10-20 DIAGNOSIS — J069 Acute upper respiratory infection, unspecified: Secondary | ICD-10-CM

## 2017-10-20 DIAGNOSIS — Z308 Encounter for other contraceptive management: Secondary | ICD-10-CM | POA: Diagnosis not present

## 2017-10-20 LAB — POCT URINE PREGNANCY: Preg Test, Ur: NEGATIVE

## 2017-10-20 MED ORDER — AMOXICILLIN 875 MG PO TABS: 875.0000 mg | ORAL_TABLET | Freq: Two times a day (BID) | ORAL | 0 refills | Status: DC

## 2017-10-20 MED FILL — AMOXICILLIN 875 MG TABLET: 875 | 10 days supply | Qty: 20 | Fill #0

## 2017-10-20 NOTE — Progress Notes (Signed)
   Subjective:    Patient ID: Mckenzie Stevens, female    DOB: 1995-11-18, 22 y.o.   MRN: [id number]  HPI  Pt is a 22 yo female who presents to the clinic with approaching one week of sinus pressure, nasal congestion, headache, dry cough. She has tried some OTC medications her mother has given her. She has not been to work for 2 days. No fever, chills, body aches, chills, wheezing or SOB.   Pt would like to discuss IUD placement. She did not like the nuva ring and struggles to remember to pills. She is hoping OCP will also help her painful periods. Pt is sexually activie.  .. Active Ambulatory Problems    Diagnosis Date Noted  . Migraines   . Recurrent UTI 06/21/2014  . Constipation 08/01/2014  . Gastroesophageal reflux disease with esophagitis 08/01/2014  . Dysmenorrhea 08/14/2017  . Frequent headaches 08/14/2017  . Anxiety state 08/14/2017  . Anxiety and depression 08/14/2017   Resolved Ambulatory Problems    Diagnosis Date Noted  . No Resolved Ambulatory Problems   Past Medical History:  Diagnosis Date  . Migraines      Review of Systems  All other systems reviewed and are negative.      Objective:   Physical Exam  Constitutional: She is oriented to person, place, and time. She appears well-developed and well-nourished.  HENT:  Head: Normocephalic and atraumatic.  Right Ear: External ear normal.  Left Ear: External ear normal.  Nose: Nose normal.  Mouth/Throat: Oropharynx is clear and moist. No oropharyngeal exudate.  TM's dull.  Tenderness over ethmoid sinuses.  Nasal turbinates red and swollen.  Oropharynx erythematous with swollen tonsils. No exudate. Uvula midline.   Eyes: Conjunctivae are normal.  Neck: Normal range of motion. Neck supple.  Cardiovascular: Normal rate, regular rhythm and normal heart sounds.  Pulmonary/Chest: Effort normal and breath sounds normal. She has no wheezes.  Abdominal: Soft. Bowel sounds are normal. There is no tenderness.   Lymphadenopathy:    She has cervical adenopathy.  Neurological: She is alert and oriented to person, place, and time.  Skin: Skin is dry.  Psychiatric: She has a normal mood and affect. Her behavior is normal.          Assessment & Plan:  .Marland Kitchen.[name] was seen today for cough and sinus drainage.  Diagnoses and all orders for this visit:  Acute upper respiratory infection -     amoxicillin (AMOXIL) 875 MG tablet; Take 1 tablet (875 mg total) by mouth 2 (two) times daily. For 10 days.  Dysmenorrhea  Encounter for other contraceptive management -     POCT urine pregnancy -     C. trachomatis/N. gonorrhoeae RNA  Acute non-recurrent ethmoidal sinusitis -     amoxicillin (AMOXIL) 875 MG tablet; Take 1 tablet (875 mg total) by mouth 2 (two) times daily. For 10 days.   Due to duration and symptoms treated with amoxil for 10 days. HO given for symptomatic care. flonase could also be really helpful for sinus symptoms. Follow up as needed.   Discussed risk/SE's of IUDS. Pt is interested in mirena/kyleena. UPT negative today. GC/Chlamydia today. Schedule with Dr. Lyn HollingsheadAlexander in the next week.

## 2017-10-20 NOTE — Patient Instructions (Signed)
Upper Respiratory Infection, Adult Most upper respiratory infections (URIs) are caused by a virus. A URI affects the nose, throat, and upper air passages. The most common type of URI is often called "the common cold." Follow these instructions at home:  Take medicines only as told by your doctor.  Gargle warm saltwater or take cough drops to comfort your throat as told by your doctor.  Use a warm mist humidifier or inhale steam from a shower to increase air moisture. This may make it easier to breathe.  Drink enough fluid to keep your pee (urine) clear or pale yellow.  Eat soups and other clear broths.  Have a healthy diet.  Rest as needed.  Go back to work when your fever is gone or your doctor says it is okay. ? You may need to stay home longer to avoid giving your URI to others. ? You can also wear a face mask and wash your hands often to prevent spread of the virus.  Use your inhaler more if you have asthma.  Do not use any tobacco products, including cigarettes, chewing tobacco, or electronic cigarettes. If you need help quitting, ask your doctor. Contact a doctor if:  You are getting worse, not better.  Your symptoms are not helped by medicine.  You have chills.  You are getting more short of breath.  You have brown or red mucus.  You have yellow or brown discharge from your nose.  You have pain in your face, especially when you bend forward.  You have a fever.  You have puffy (swollen) neck glands.  You have pain while swallowing.  You have white areas in the back of your throat. Get help right away if:  You have very bad or constant: ? Headache. ? Ear pain. ? Pain in your forehead, behind your eyes, and over your cheekbones (sinus pain). ? Chest pain.  You have long-lasting (chronic) lung disease and any of the following: ? Wheezing. ? Long-lasting cough. ? Coughing up blood. ? A change in your usual mucus.  You have a stiff neck.  You have  changes in your: ? Vision. ? Hearing. ? Thinking. ? Mood. This information is not intended to replace advice given to you by your health care provider. Make sure you discuss any questions you have with your health care provider. Document Released: 01/06/2008 Document Revised: 03/22/2016 Document Reviewed: 10/25/2013 Elsevier Interactive Patient Education  2018 [employer]  

## 2017-10-21 LAB — C. TRACHOMATIS/N. GONORRHOEAE RNA
C. trachomatis RNA, TMA: NOT DETECTED
N. GONORRHOEAE RNA, TMA: NOT DETECTED

## 2017-10-22 ENCOUNTER — Encounter: Payer: Self-pay | Admitting: Physician Assistant

## 2017-10-22 NOTE — Progress Notes (Signed)
Call pt: negative for STD's and UPT ngeative. Make sure scheduled with Dr. Lyn HollingsheadAlexander for IUD placement to consider mirena vs Kyleena.   FYI Dr. Lyn HollingsheadAlexander.

## 2017-10-26 ENCOUNTER — Encounter: Payer: Self-pay | Admitting: Osteopathic Medicine

## 2017-10-26 ENCOUNTER — Other Ambulatory Visit (HOSPITAL_COMMUNITY)
Admission: RE | Admit: 2017-10-26 | Discharge: 2017-10-26 | Disposition: A | Discharge status: Home / Self Care | Payer: No Typology Code available for payment source | Source: Ambulatory Visit | Attending: Family Medicine | Admitting: Family Medicine

## 2017-10-26 ENCOUNTER — Ambulatory Visit (INDEPENDENT_AMBULATORY_CARE_PROVIDER_SITE_OTHER): Payer: No Typology Code available for payment source | Admitting: Osteopathic Medicine

## 2017-10-26 VITALS — BP 120/71 | HR 73 | Temp 98.1°F | Wt 158.0 lb

## 2017-10-26 DIAGNOSIS — Z23 Encounter for immunization: Secondary | ICD-10-CM | POA: Diagnosis not present

## 2017-10-26 DIAGNOSIS — Z30430 Encounter for insertion of intrauterine contraceptive device: Secondary | ICD-10-CM | POA: Diagnosis not present

## 2017-10-26 DIAGNOSIS — Z124 Encounter for screening for malignant neoplasm of cervix: Secondary | ICD-10-CM

## 2017-10-26 LAB — POCT URINE PREGNANCY: PREG TEST UR: NEGATIVE

## 2017-10-26 NOTE — Progress Notes (Signed)
HPI: Mckenzie Stevens is a 22 y.o. female who presents to [facility] today 10/26/17 for chief complaint of: IUD insertion   Patient is seen at the request of Tandy Gaw PA-C for consultation regarding IUD birth control.      Past medical, social and family history reviewed: Patient Active Problem List   Diagnosis Date Noted  . Dysmenorrhea 08/14/2017  . Frequent headaches 08/14/2017  . Anxiety state 08/14/2017  . Anxiety and depression 08/14/2017  . Constipation 08/01/2014  . Gastroesophageal reflux disease with esophagitis 08/01/2014  . Recurrent UTI 06/21/2014  . Migraines    No past surgical history on file. Social History   Socioeconomic History  . Marital status: Single    Spouse name: Not on file  . Number of children: Not on file  . Years of education: Not on file  . Highest education level: Not on file  Occupational History  . Not on file  Social Needs  . Financial resource strain: Not on file  . Food insecurity:    Worry: Not on file    Inability: Not on file  . Transportation needs:    Medical: Not on file    Non-medical: Not on file  Tobacco Use  . Smoking status: Current Every Day Smoker    Packs/day: 0.25    Types: Cigarettes  . Smokeless tobacco: Current User  Substance and Sexual Activity  . Alcohol use: No    Alcohol/week: 0.0 oz  . Drug use: No  . Sexual activity: Yes    Partners: Male  Lifestyle  . Physical activity:    Days per week: Not on file    Minutes per session: Not on file  . Stress: Not on file  Relationships  . Social connections:    Talks on phone: Not on file    Gets together: Not on file    Attends religious service: Not on file    Active member of club or organization: Not on file    Attends meetings of clubs or organizations: Not on file    Relationship status: Not on file  . Intimate partner violence:    Fear of current or ex partner: Not on file    Emotionally abused: Not on file     Physically abused: Not on file    Forced sexual activity: Not on file  Other Topics Concern  . Not on file  Social History Narrative  . Not on file   Family History  Problem Relation Age of Onset  . Hyperlipidemia Mother   . Hypertension Mother   . Diabetes Unknown        grandfather   . Skin cancer Unknown        grandfather        Current Outpatient Medications (Analgesics):  .  butalbital-aspirin-caffeine (FIORINAL) 50-325-40 MG capsule, Take 1 capsule by mouth 2 (two) times daily as needed for headache. .  SUMAtriptan (IMITREX) 50 MG tablet, Take 1 tablet (50 mg total) by mouth every 2 (two) hours as needed for migraine or headache.   Current Outpatient Medications (Other):  .  citalopram (CELEXA) 20 MG tablet, Take 1 tablet (20 mg total) by mouth daily. .  hydrOXYzine (ATARAX/VISTARIL) 25 MG tablet, Take 1 tablet (25 mg total) by mouth 2 (two) times daily as needed for anxiety. .  ondansetron (ZOFRAN ODT) 4 MG disintegrating tablet, Take one tab by mouth Q6hr prn nausea.  Dissolve under tongue. No Known Allergies  Review of Systems: CONSTITUTIONAL:  No  fever, no chills HEAD/EYES/EARS/NOSE/THROAT: No  headache, no vision change, no history migraines CARDIAC: No  chest pain, No  pressure, No palpitations RESPIRATORY: No  cough, No  shortness of breath/wheeze GASTROINTESTINAL: No  nausea, No  vomiting, No  abdominal pain GENITOURINARY: No  incontinence, No  abnormal genital bleeding/discharge SKIN: No  rash/wounds/concerning lesions   Exam:  BP 120/71 (BP Location: Left Arm, Patient Position: Sitting, Cuff Size: Normal)   Pulse 73   Temp 98.1 F (36.7 C) (Oral)   Wt 158 lb (71.7 kg)   BMI 27.99 kg/m  Constitutional: VS see above. General Appearance: alert, well-developed, well-nourished, NAD Psychiatric: Normal judgment/insight. Normal mood and affect. Oriented x3.  GYN: see procedure note below  Results for orders placed or performed in visit on  10/26/17 (from the past 24 hour(s))  POCT urine pregnancy     Status: None   Collection Time: 10/26/17  9:27 AM  Result Value Ref Range   Preg Test, Ur Negative Negative    IUD PROCEDURE NOTE  PERTINENT RESULTS REVIEWED: PREGNANCY TEST PRIOR TO PROCEDURE: Negative GONORRHEA/CHLAMYDIA SCREEN: Negative  PRIOR TO PROCEDURE: INFORMED CONSENT OBTAINED: yes SEE SCANNED DOCUMENTS ANY PRETREATMENT: no  PHYSICAL EXAM: GYN: No lesions/ulcers to external genitalia, normal urethra, normal vaginal mucosa, physiologic discharge, cervix normal without lesions, uterus not enlarged or tender, adnexa no masses and nontender  DESCRIPTION OF PROCEDURE: Vaginal speculum placed. Cervix and proximal vagina cleaned with Betadine.Tenaculum applied at 12:00 cervical position and gentle traction applied. Uterus sounded to 7 cm. IUD placed without difficulty. IUD threads cut to 2-3cm from cervical os. Tenaculum and speculum removed. Patient felt strings. Patient tolerated procedure well. Sterile technique maintained.   IUD INFORMATION: BRAND: Kyleena LOT NUMBER: [id number] CARD GIVEN TO PATIENT: yes    ASSESSMENT/PLAN: Patient was counseled on the risks versus benefits of IUD contraception, including excellent contraceptive efficacy but risk of uterine perforation, small chance of ascending infection, irregular bleeding, and others. In light of her preferences, previous contraception experience, other risk factors as noted in history of present illness. Patient opts to proceed with insertion of Kyleena IUD - see procedure note.  Patient was educated on the process for insertion, including what to expect from vaginal exam and insertion process, reasons that we would abort the procedure such as abnormal anatomy, non-passage of the uterine sound, patient inability to tolerate the procedure, or other.  Encounter for IUD insertion - Plan: POCT urine pregnancy  Cervical cancer screening - Plan: Cytology -  PAP   Patient Instructions  IUD AFTER-CARE INSTRUCTIONS: READ THOROUGHLY  Your Rutha Bouchard IUD is currently approved to remain in place for 5 years. At that time, if you wish to receive a new IUD, this can be placed when your current one is removed. If you wish to remove your IUD at any time, for any reason, this can be done easily by your doctor.   You should feel for the strings to your IUD routinely. If you cannot locate the strings, it is recommended you alert your doctor of this.   Be aware that in the first few weeks, your new IUD may cause some discomfort/cramping as it settles into place in your uterus. To ease discomfort, you may apply heating pad to abdomen and take Ibuprofen 800mg  by mouth every 6 hours as needed, but avoid using this dose continuously for more than 5 days. Walking helps as well. You can also expect some irregular bleeding but it should not  be heavy for more than a few days.   If pain is severe or if severe bleeding occurs, or if foul-smelling discharge or fever develops, or if you have any other concerns - contact your doctor right away or go to the Emergency Room.  IUD's are a very reliable method of birth control, but no method is 100% effective. If you think you may be pregnant, see your doctor right away.   An IUD will not protect you from sexually transmitted infections such as HIV, gonorrhea, chlamydia, HPV and others.   It is recommended that you see your doctor as directed for routine well-woman care, which includes Pap testing and may include screening for infections.      All questions were answered. Visit summary with updated medication list and pertinent instructions was printed for patient. ER/RTC precautions were reviewed with the patient. Return for IUD string check in 4 weeks as directed.   Total time spent 25 minutes, greater than 50% of the visit was counseling and coordinating care for diagnosis of The encounter diagnosis was Encounter for IUD  insertion.

## 2017-10-26 NOTE — Patient Instructions (Signed)
IUD AFTER-CARE INSTRUCTIONS: READ THOROUGHLY  Your Kyleena IUD is currently approved to remain in place for 5 years. At that time, if you wish to receive a new IUD, this can be placed when your current one is removed. If you wish to remove your IUD at any time, for any reason, this can be done easily by your doctor.   You should feel for the strings to your IUD routinely. If you cannot locate the strings, it is recommended you alert your doctor of this.   Be aware that in the first few weeks, your new IUD may cause some discomfort/cramping as it settles into place in your uterus. To ease discomfort, you may apply heating pad to abdomen and take Ibuprofen 800mg by mouth every 6 hours as needed, but avoid using this dose continuously for more than 5 days. Walking helps as well. You can also expect some irregular bleeding but it should not be heavy for more than a few days.   If pain is severe or if severe bleeding occurs, or if foul-smelling discharge or fever develops, or if you have any other concerns - contact your doctor right away or go to the Emergency Room.  IUD's are a very reliable method of birth control, but no method is 100% effective. If you think you may be pregnant, see your doctor right away.   An IUD will not protect you from sexually transmitted infections such as HIV, gonorrhea, chlamydia, HPV and others.   It is recommended that you see your doctor as directed for routine well-woman care, which includes Pap testing and may include screening for infections.   

## 2017-10-26 NOTE — Progress Notes (Signed)
POCT

## 2017-10-27 LAB — CYTOLOGY - PAP: Diagnosis: NEGATIVE

## 2017-11-03 ENCOUNTER — Encounter: Payer: Self-pay | Admitting: Osteopathic Medicine

## 2017-11-03 ENCOUNTER — Other Ambulatory Visit: Payer: Self-pay | Admitting: Physician Assistant

## 2017-11-03 DIAGNOSIS — T839XXA Unspecified complication of genitourinary prosthetic device, implant and graft, initial encounter: Secondary | ICD-10-CM

## 2017-11-03 DIAGNOSIS — R102 Pelvic and perineal pain: Secondary | ICD-10-CM

## 2017-11-03 NOTE — Telephone Encounter (Signed)
Spoke with Pt. She is working until [employer]7pm tonight, so requested appt for tomorrow. appt scheduled.

## 2017-11-04 ENCOUNTER — Ambulatory Visit (INDEPENDENT_AMBULATORY_CARE_PROVIDER_SITE_OTHER): Payer: No Typology Code available for payment source

## 2017-11-04 ENCOUNTER — Ambulatory Visit (INDEPENDENT_AMBULATORY_CARE_PROVIDER_SITE_OTHER): Payer: No Typology Code available for payment source | Admitting: Physician Assistant

## 2017-11-04 ENCOUNTER — Encounter: Payer: Self-pay | Admitting: Physician Assistant

## 2017-11-04 VITALS — BP 115/81 | HR 74 | Temp 98.1°F | Resp 14 | Wt 160.0 lb

## 2017-11-04 DIAGNOSIS — N83202 Unspecified ovarian cyst, left side: Secondary | ICD-10-CM

## 2017-11-04 DIAGNOSIS — Z975 Presence of (intrauterine) contraceptive device: Secondary | ICD-10-CM

## 2017-11-04 DIAGNOSIS — R102 Pelvic and perineal pain unspecified side: Secondary | ICD-10-CM

## 2017-11-04 DIAGNOSIS — T839XXA Unspecified complication of genitourinary prosthetic device, implant and graft, initial encounter: Secondary | ICD-10-CM

## 2017-11-04 LAB — POCT HEMOGLOBIN: Hemoglobin: 13.5 g/dL (ref 12.2–16.2)

## 2017-11-04 MED ORDER — TRAMADOL HCL 50 MG PO TABS: 50.0000 mg | ORAL_TABLET | Freq: Three times a day (TID) | ORAL | 0 refills | Status: AC | PRN

## 2017-11-04 MED ORDER — ONDANSETRON 4 MG PO TBDP: ORAL_TABLET | ORAL | 0 refills | Status: AC

## 2017-11-04 MED ORDER — KETOROLAC TROMETHAMINE 30 MG/ML IJ SOLN
30.0000 mg | Freq: Once | INTRAMUSCULAR | Status: AC
Start: 2017-11-04 — End: 2017-11-04
  Administered 2017-11-04: 30 mg via INTRAMUSCULAR

## 2017-11-04 MED ORDER — NAPROXEN 500 MG PO TABS: 500.0000 mg | ORAL_TABLET | Freq: Two times a day (BID) | ORAL | 0 refills | Status: AC

## 2017-11-04 MED FILL — NAPROXEN 500 MG TABLET: 500 | 15 days supply | Qty: 30 | Fill #0

## 2017-11-04 MED FILL — traMADol HCL 50 MG TABS: 50 | 10 days supply | Qty: 30 | Fill #0

## 2017-11-04 MED FILL — ONDANSETRON ODT 4 MG TABLET: 4 | 5 days supply | Qty: 16 | Fill #0

## 2017-11-04 NOTE — Patient Instructions (Addendum)
- the ER for: heavy bleeding, severe/worsening abdominal/pelvic pain, dizziness/lightheadednes/weakness/near passing out - you can start Naprosyn tonight/ or in 5 hours when the injection wears off - Tramadol every 8 hours as needed for moderate pain  Ovarian Cyst An ovarian cyst is a fluid-filled sac that forms on an ovary. The ovaries are small organs that produce eggs in women. Various types of cysts can form on the ovaries. Some may cause symptoms and require treatment. Most ovarian cysts go away on their own, are not cancerous (are benign), and do not cause problems. Common types of ovarian cysts include:  Functional (follicle) cysts. ? Occur during the menstrual cycle, and usually go away with the next menstrual cycle if you do not get pregnant. ? Usually cause no symptoms.  Endometriomas. ? Are cysts that form from the tissue that lines the uterus (endometrium). ? Are sometimes called "chocolate cysts" because they become filled with blood that turns brown. ? Can cause pain in the lower abdomen during intercourse and during your period.  Cystadenoma cysts. ? Develop from cells on the outside surface of the ovary. ? Can get very large and cause lower abdomen pain and pain with intercourse. ? Can cause severe pain if they twist or break open (rupture).  Dermoid cysts. ? Are sometimes found in both ovaries. ? May contain different kinds of body tissue, such as skin, teeth, hair, or cartilage. ? Usually do not cause symptoms unless they get very big.  Theca lutein cysts. ? Occur when too much of a certain hormone (human chorionic gonadotropin) is produced and overstimulates the ovaries to produce an egg. ? Are most common after having procedures used to assist with the conception of a baby (in vitro fertilization).  What are the causes? Ovarian cysts may be caused by:  Ovarian hyperstimulation syndrome. This is a condition that can develop from taking fertility medicines. It  causes multiple large ovarian cysts to form.  Polycystic ovarian syndrome (PCOS). This is a common hormonal disorder that can cause ovarian cysts, as well as problems with your period or fertility.  What increases the risk? The following factors may make you more likely to develop ovarian cysts:  Being overweight or obese.  Taking fertility medicines.  Taking certain forms of hormonal birth control.  Smoking.  What are the signs or symptoms? Many ovarian cysts do not cause symptoms. If symptoms are present, they may include:  Pelvic pain or pressure.  Pain in the lower abdomen.  Pain during sex.  Abdominal swelling.  Abnormal menstrual periods.  Increasing pain with menstrual periods.  How is this diagnosed? These cysts are commonly found during a routine pelvic exam. You may have tests to find out more about the cyst, such as:  Ultrasound.  X-ray of the pelvis.  CT scan.  MRI.  Blood tests.  How is this treated? Many ovarian cysts go away on their own without treatment. Your health care provider may want to check your cyst regularly for 2-3 months to see if it changes. If you are in menopause, it is especially important to have your cyst monitored closely because menopausal women have a higher rate of ovarian cancer. When treatment is needed, it may include:  Medicines to help relieve pain.  A procedure to drain the cyst (aspiration).  Surgery to remove the whole cyst.  Hormone treatment or birth control pills. These methods are sometimes used to help dissolve a cyst.  Follow these instructions at home:  Take over-the-counter and prescription  medicines only as told by your health care provider.  Do not drive or use heavy machinery while taking prescription pain medicine.  Get regular pelvic exams and Pap tests as often as told by your health care provider.  Return to your normal activities as told by your health care provider. Ask your health care  provider what activities are safe for you.  Do not use any products that contain nicotine or tobacco, such as cigarettes and e-cigarettes. If you need help quitting, ask your health care provider.  Keep all follow-up visits as told by your health care provider. This is important. Contact a health care provider if:  Your periods are late, irregular, or painful, or they stop.  You have pelvic pain that does not go away.  You have pressure on your bladder or trouble emptying your bladder completely.  You have pain during sex.  You have any of the following in your abdomen: ? A feeling of fullness. ? Pressure. ? Discomfort. ? Pain that does not go away. ? Swelling.  You feel generally ill.  You become constipated.  You lose your appetite.  You develop severe acne.  You start to have more body hair and facial hair.  You are gaining weight or losing weight without changing your exercise and eating habits.  You think you may be pregnant. Get help right away if:  You have abdominal pain that is severe or gets worse.  You cannot eat or drink without vomiting.  You suddenly develop a fever.  Your menstrual period is much heavier than usual. This information is not intended to replace advice given to you by your health care provider. Make sure you discuss any questions you have with your health care provider. Document Released: 07/20/2005 Document Revised: 02/07/2016 Document Reviewed: 12/22/2015 Elsevier Interactive Patient Education  [employer].

## 2017-11-04 NOTE — Progress Notes (Signed)
HPI:                                                                Mckenzie Stevens is a 22 y.o. female who presents to [facility]: Primary Care Sports Medicine today for pelvic pain  Pleasant 22 yo G0P0 with PMH of dysmenorrhea, migraines, recurrent UTI presenting with 8 days of moderate-severe pelvic pain and cramping. Symptoms began after having Kyleena IUD placed on 10/26/17. 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 This seems to worsen her pain. Intercourse does not worsen pain. Denies history of ovarian cysts, endometriosis, fibroids. Denies fever, chills, nausea, vomiting, dizziness, lightheadedness, or syncope. Reports light bleeding beginning today, but she is due for her menstrual period.   GYN/Sexual Health  Obstetrics: G0P0  Menstrual status: having periods  LMP: 10/05/2017  Menses: regular  Last pap smear: 10/26/17 NILM  History of abnormal pap smears: no  Sexually active: yes  Current contraception: IUD  History of STI: no   Health Maintenance Health Maintenance  Topic Date Due  . HIV Screening  09/20/2011  . INFLUENZA VACCINE  04/15/2018 (Originally 03/03/2018)  . CHLAMYDIA SCREENING  10/21/2018  . PAP SMEAR  10/26/2020  . TETANUS/TDAP  10/27/2027    Past Medical History:  Diagnosis Date  . Migraines    History reviewed. No pertinent surgical history. Social History   Tobacco Use  . Smoking status: Current Every Day Smoker    Packs/day: 0.25    Types: Cigarettes  . Smokeless tobacco: Current User  Substance Use Topics  . Alcohol use: No    Alcohol/week: 0.0 oz   family history includes Diabetes in her unknown relative; Hyperlipidemia in her mother; Hypertension in her mother; Skin cancer in her unknown relative.  ROS: negative except as noted in the HPI  Medications: Current Outpatient Medications  Medication Sig Dispense Refill  . butalbital-aspirin-caffeine (FIORINAL) 50-325-40  MG capsule Take 1 capsule by mouth 2 (two) times daily as needed for headache. 14 capsule 1  . citalopram (CELEXA) 20 MG tablet Take 1 tablet (20 mg total) by mouth daily. 90 tablet 1  . hydrOXYzine (ATARAX/VISTARIL) 25 MG tablet Take 1 tablet (25 mg total) by mouth 2 (two) times daily as needed for anxiety. 30 tablet 5  . Levonorgestrel 19.5 MG IUD by Intrauterine route.    . SUMAtriptan (IMITREX) 50 MG tablet Take 1 tablet (50 mg total) by mouth every 2 (two) hours as needed for migraine or headache. 10 tablet 11  . naproxen (NAPROSYN) 500 MG tablet Take 1 tablet (500 mg total) by mouth 2 (two) times daily with a meal. 30 tablet 0  . ondansetron (ZOFRAN-ODT) 4 MG disintegrating tablet 1 tab PO dissolved under tongue 20 minutes before pain med 16 tablet 0  . traMADol (ULTRAM) 50 MG tablet Take 1 tablet (50 mg total) by mouth every 8 (eight) hours as needed. 30 tablet 0   No current facility-administered medications for this visit.    No Known Allergies     Objective:  BP 115/81   Pulse 74   Temp 98.1 F (36.7 C) (Oral)   Resp 14   Wt 160 lb (72.6 kg)   LMP 10/05/2017 (Approximate)   BMI 28.34 kg/m  Gen:  alert, not ill-appearing, tearful and appears uncomfortable, no acute distress, appropriate for age HEENT: head normocephalic without obvious abnormality, conjunctiva and cornea clear, trachea midline Pulm: Normal work of breathing, normal phonation, clear to auscultation bilaterally, no wheezes, rales or rhonchi CV: Normal rate, regular rhythm, s1 and s2 distinct, no murmurs, clicks or rubs; radial pulses 2+ symmetric  GI: abdomen soft, non-tender, no rigidity or guarding GU: vulva without rashes or lesions, normal introitus and urethral meatus, vaginal mucosa without erythema, scant amount of bright red blood in the vaginal vault, IUD strings visible, left adnexal tenderness Neuro: alert and oriented x 3, no tremor MSK: extremities atraumatic, normal gait and station Skin:  intact, no rashes on exposed skin, no jaundice, no cyanosis  A chaperone was used for the GU portion of the exam, Olivia MackieEvonia Henry, CMA.    Results for orders placed or performed in visit on 11/04/17 (from the past 72 hour(s))  POCT hemoglobin     Status: Normal   Collection Time: 11/04/17  2:03 PM  Result Value Ref Range   Hemoglobin 13.5 12.2 - 16.2 g/dL   [place] Pelvis Transvanginal Non-ob (tv Only)  Result Date: 11/04/2017 CLINICAL DATA:  Pelvic pain and cramping EXAM: TRANSABDOMINAL AND TRANSVAGINAL ULTRASOUND OF PELVIS TECHNIQUE: Study was performed transabdominally to optimize pelvic field of view evaluation and transvaginally to optimize internal visceral architecture evaluation. COMPARISON:  None FINDINGS: Uterus Measurements: 7.1 x 2.8 x 3.2 cm. No fibroids or other mass visualized. Endometrium Thickness: 4 mm. Intrauterine device is positioned within the endometrium. No focal abnormality visualized. Right ovary Measurements: 2.6 x 1.9 x 3.2 cm. Normal appearance/no adnexal mass. Left ovary Measurements: 3.2 x 1.9 x 2.9 cm. There is a probable hemorrhagic cyst in the left ovary measuring 1.4 x 1.3 x 1.5 cm. No other extrauterine pelvic mass on the left. Other findings No abnormal free fluid. IMPRESSION: 1. Probable hemorrhagic cyst left ovary measuring 1.4 x 1.3 x 1.5 cm. Short-interval follow up ultrasound in 6-12 weeks is recommended, preferably during the week following the patient's normal menses. 2. Intrauterine device within the endometrium. Endometrium is not thickened. 3.  Study otherwise unremarkable. Electronically Signed   By: Bretta BangWilliam  Woodruff III M.D.   On: 11/04/2017 11:45   [place] Pelvis (transabdominal Only)  Result Date: 11/04/2017 CLINICAL DATA:  Pelvic pain and cramping EXAM: TRANSABDOMINAL AND TRANSVAGINAL ULTRASOUND OF PELVIS TECHNIQUE: Study was performed transabdominally to optimize pelvic field of view evaluation and transvaginally to optimize internal visceral architecture  evaluation. COMPARISON:  None FINDINGS: Uterus Measurements: 7.1 x 2.8 x 3.2 cm. No fibroids or other mass visualized. Endometrium Thickness: 4 mm. Intrauterine device is positioned within the endometrium. No focal abnormality visualized. Right ovary Measurements: 2.6 x 1.9 x 3.2 cm. Normal appearance/no adnexal mass. Left ovary Measurements: 3.2 x 1.9 x 2.9 cm. There is a probable hemorrhagic cyst in the left ovary measuring 1.4 x 1.3 x 1.5 cm. No other extrauterine pelvic mass on the left. Other findings No abnormal free fluid. IMPRESSION: 1. Probable hemorrhagic cyst left ovary measuring 1.4 x 1.3 x 1.5 cm. Short-interval follow up ultrasound in 6-12 weeks is recommended, preferably during the week following the patient's normal menses. 2. Intrauterine device within the endometrium. Endometrium is not thickened. 3.  Study otherwise unremarkable. Electronically Signed   By: Bretta BangWilliam  Woodruff III M.D.   On: 11/04/2017 11:45      Assessment and Plan: 22 y.o. female with   1. Hemorrhagic cyst of left ovary - personally  reviewed hemorrhagic cyst on TV US today. Repeat US recommended in 6-8 weeks - POC Hgb 13.5 today, no anemia. Patient is hemodynamically stable. Symptomatic management with NSAID and analgesics as needed. Active surveillance. Advised of return/ED precautions. Close follow-up with Gynecology in 4 weeks   2. IUD (intrauterine device) in place - string check today. Placement confirmed with pelvic US. Pain is not due to IUD complication  Hemorrhagic cyst of left ovary - Plan: naproxen (NAPROSYN) 500 MG tablet, traMADol (ULTRAM) 50 MG tablet, Ambulatory referral to Obstetrics / Gynecology, POCT hemoglobin, ketorolac (TORADOL) 30 MG/ML injection 30 mg  IUD (intrauterine device) in place - Kyleena placed 10/26/17  Acute pelvic pain   Patient education and anticipatory guidance given Patient agrees with treatment plan Close follow-up with Gynecology in 4 weeks or sooner if  needed  Levonne Hubert PA-C

## 2017-11-08 ENCOUNTER — Telehealth: Payer: Self-pay | Admitting: Obstetrics & Gynecology

## 2017-11-08 ENCOUNTER — Other Ambulatory Visit: Payer: Self-pay | Admitting: *Deleted

## 2017-11-08 DIAGNOSIS — N83202 Unspecified ovarian cyst, left side: Secondary | ICD-10-CM

## 2017-11-08 NOTE — Telephone Encounter (Signed)
Mother called states pt had ultrasound ordered by pcp that "revealed hemorrhagic cyst uterus". moter made new gyn appt and would like appt for ultrasound same day in the morning so dr dove can review results and compare at initial visit.

## 2017-11-09 NOTE — Telephone Encounter (Signed)
Called pt confirmed appt 11/29/17 @ 10:30 for TVU and appt with Dove at 2:00. Pt verbalized understanding and will keep both appointments.

## 2017-11-24 ENCOUNTER — Ambulatory Visit: Payer: No Typology Code available for payment source | Admitting: Osteopathic Medicine

## 2017-11-24 DIAGNOSIS — Z0189 Encounter for other specified special examinations: Secondary | ICD-10-CM

## 2017-11-29 ENCOUNTER — Other Ambulatory Visit: Payer: Self-pay | Admitting: Physician Assistant

## 2017-11-29 ENCOUNTER — Ambulatory Visit (INDEPENDENT_AMBULATORY_CARE_PROVIDER_SITE_OTHER): Payer: No Typology Code available for payment source

## 2017-11-29 ENCOUNTER — Encounter: Payer: Self-pay | Admitting: Obstetrics & Gynecology

## 2017-11-29 ENCOUNTER — Ambulatory Visit: Payer: No Typology Code available for payment source | Admitting: Obstetrics & Gynecology

## 2017-11-29 VITALS — BP 102/61 | HR 61 | Ht 63.0 in | Wt 163.0 lb

## 2017-11-29 DIAGNOSIS — Z23 Encounter for immunization: Secondary | ICD-10-CM

## 2017-11-29 DIAGNOSIS — Z975 Presence of (intrauterine) contraceptive device: Secondary | ICD-10-CM

## 2017-11-29 DIAGNOSIS — Z0000 Encounter for general adult medical examination without abnormal findings: Secondary | ICD-10-CM

## 2017-11-29 DIAGNOSIS — R300 Dysuria: Secondary | ICD-10-CM

## 2017-11-29 DIAGNOSIS — Z113 Encounter for screening for infections with a predominantly sexual mode of transmission: Secondary | ICD-10-CM | POA: Diagnosis not present

## 2017-11-29 DIAGNOSIS — N83202 Unspecified ovarian cyst, left side: Secondary | ICD-10-CM | POA: Diagnosis not present

## 2017-11-29 MED FILL — SUMATRIPTAN SUCC 50 MG TAB: 50 | 30 days supply | Qty: 9 | Fill #0

## 2017-11-29 MED FILL — BUTALBITAL-ASA-CAFFEINE CAP: 50-325-40 | 7 days supply | Qty: 14 | Fill #1

## 2017-11-29 MED FILL — hydrOXYzine HCL 25 MG TABS: 25 | 15 days supply | Qty: 30 | Fill #1

## 2017-11-29 NOTE — Progress Notes (Signed)
Ovarian cyst

## 2017-11-29 NOTE — Progress Notes (Signed)
   Subjective:    Patient ID: Mckenzie Stevens, female    DOB: 1995/11/14, 22 y.o.   MRN: [id number]  HPI 22 yo single P0 here today to review her u/s done today. This was ordered to follow up on an ovarian cyst. She is not having any problems today.   Review of Systems She has not had Gardasil yet [place] around 70, partners less than 10  Pap normal 3/19 Her IUD was in a normal position on the u/s.    Objective:   Physical Exam Breathing, conversing, and ambulating normally Well nourished, well hydrated White female, no apparent distress Abd- benign       Assessment & Plan:  Preventative care- Gardasil today, STI testing Reassurance given regarding ovarian cysts She can have OCPs prn cyst return

## 2017-11-30 LAB — CERVICOVAGINAL ANCILLARY ONLY
Bacterial vaginitis: POSITIVE — AB
Candida vaginitis: NEGATIVE
Chlamydia: NEGATIVE
NEISSERIA GONORRHEA: NEGATIVE

## 2017-11-30 LAB — HEPATITIS C ANTIBODY
HEP C AB: NONREACTIVE
SIGNAL TO CUT-OFF: 0.01 (ref <1.00)

## 2017-11-30 LAB — HEPATITIS B SURFACE ANTIGEN: Hepatitis B Surface Ag: NONREACTIVE

## 2017-11-30 LAB — HIV ANTIBODY (ROUTINE TESTING W REFLEX): HIV: NONREACTIVE

## 2017-11-30 LAB — RPR: RPR Ser Ql: NONREACTIVE

## 2017-12-01 ENCOUNTER — Telehealth: Payer: Self-pay | Admitting: *Deleted

## 2017-12-01 LAB — URINE CULTURE
MICRO NUMBER:: 90519927
SPECIMEN QUALITY: ADEQUATE

## 2017-12-01 MED ORDER — METRONIDAZOLE 500 MG PO TABS: 500.0000 mg | ORAL_TABLET | Freq: Two times a day (BID) | ORAL | 0 refills | Status: AC

## 2017-12-01 MED FILL — metroNIDAZOLE 500 MG TABS: 500 | 7 days supply | Qty: 14 | Fill #0

## 2017-12-01 NOTE — Telephone Encounter (Signed)
-----   Message from Allie Bossier, MD sent at 12/01/2017  1:12 PM EDT ----- Can you please prescribe flagyl for her bv? Thanks

## 2017-12-01 NOTE — Telephone Encounter (Signed)
Pt notified of positive BV and per Dr Marice Potter she may have Flagyl 500 mg.  The RX was sent to [facility] outpatient pharmacy.

## 2017-12-02 ENCOUNTER — Encounter: Payer: Self-pay | Admitting: Obstetrics & Gynecology

## 2018-02-09 ENCOUNTER — Ambulatory Visit (INDEPENDENT_AMBULATORY_CARE_PROVIDER_SITE_OTHER): Payer: No Typology Code available for payment source | Admitting: *Deleted

## 2018-02-09 DIAGNOSIS — Z23 Encounter for immunization: Secondary | ICD-10-CM | POA: Diagnosis not present

## 2018-02-14 MED FILL — CITALOPRAM HBR 20 MG TABLET: 20 | 90 days supply | Qty: 90 | Fill #1

## 2018-02-14 MED FILL — SUMATRIPTAN SUCC 50 MG TAB: 50 | 30 days supply | Qty: 9 | Fill #1

## 2018-02-14 MED FILL — hydrOXYzine HCL 25 MG TABS: 25 | 60 days supply | Qty: 120 | Fill #2

## 2018-03-07 ENCOUNTER — Ambulatory Visit: Payer: No Typology Code available for payment source | Admitting: Physician Assistant

## 2018-05-04 ENCOUNTER — Ambulatory Visit: Payer: No Typology Code available for payment source

## 2018-06-14 ENCOUNTER — Ambulatory Visit: Payer: No Typology Code available for payment source

## 2019-02-27 IMAGING — US US PELVIS COMPLETE
1 series · 14 of 25 positions shown · non-contrast
Comparison: None

CLINICAL DATA: Pelvic pain and cramping

EXAM:
TRANSABDOMINAL AND TRANSVAGINAL ULTRASOUND OF PELVIS
TECHNIQUE: Study was performed transabdominally to optimize pelvic field of
view evaluation and transvaginally to optimize internal visceral
architecture evaluation.

[Series 1: us pelvis complete · 0.20mm/px · 14 of 104 slices shown]
[im 1/104]
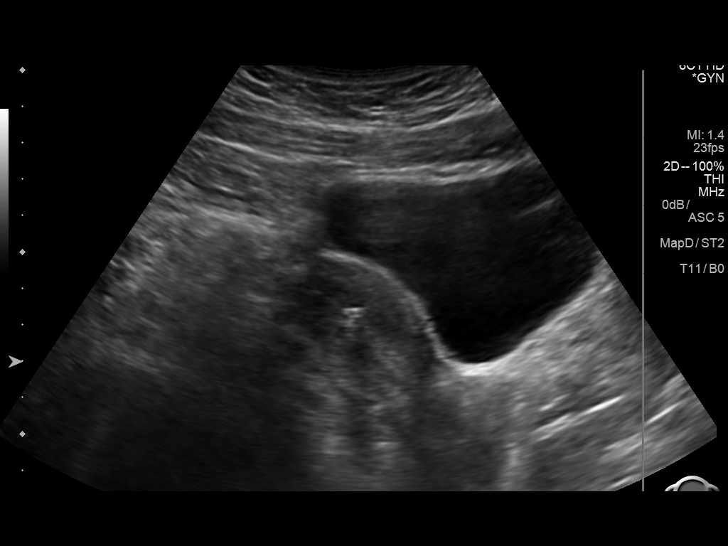
[im 9/104]
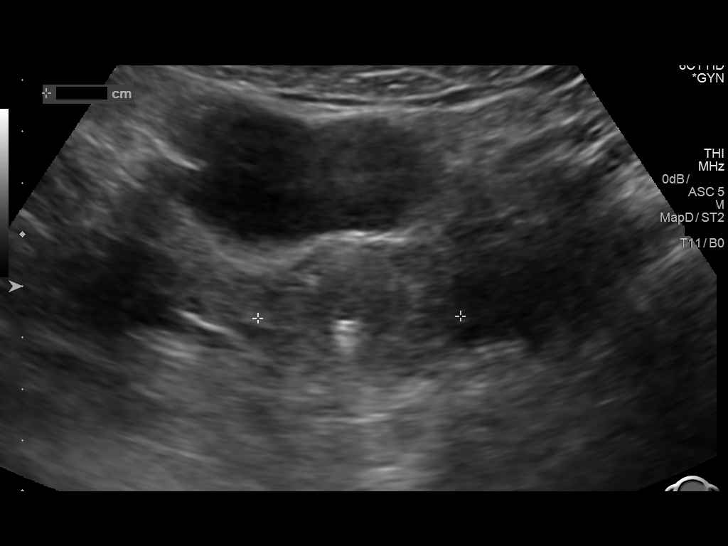
[im 18/104]
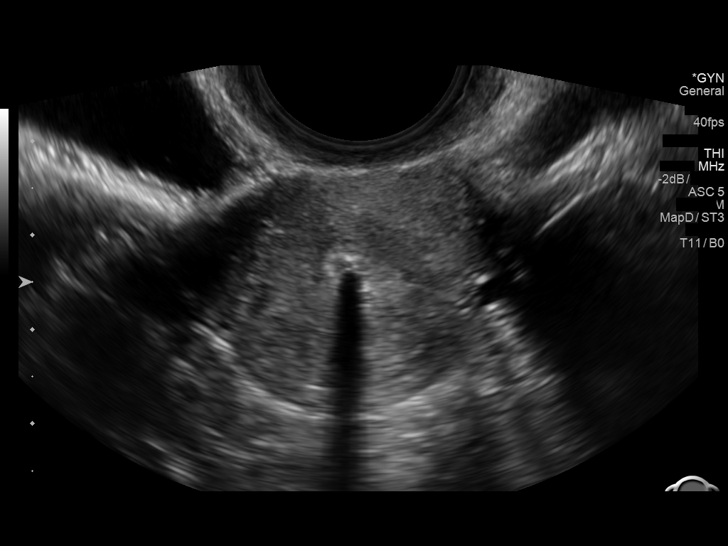
[im 26/104]
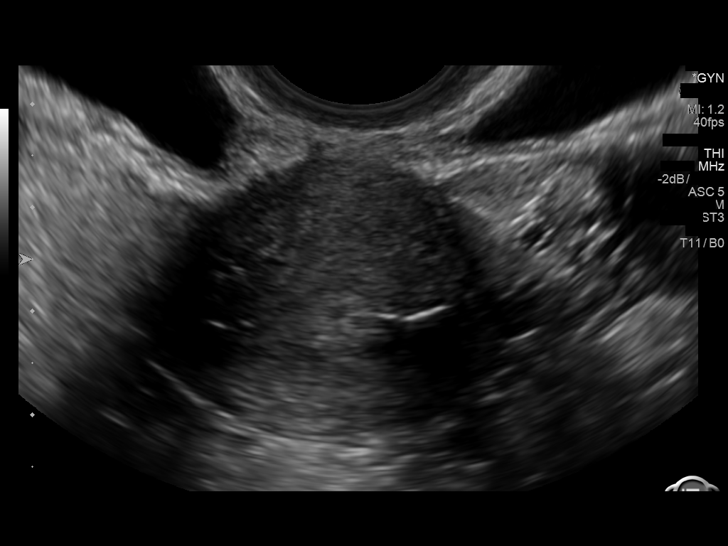
[im 35/104]
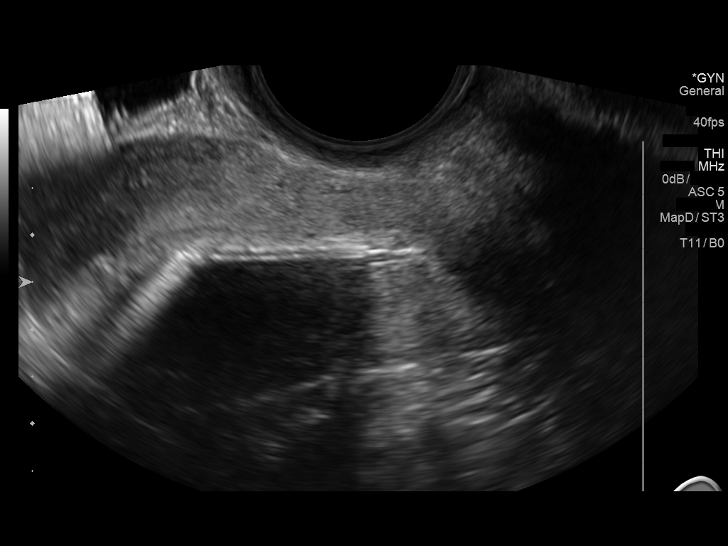
[im 39/104]
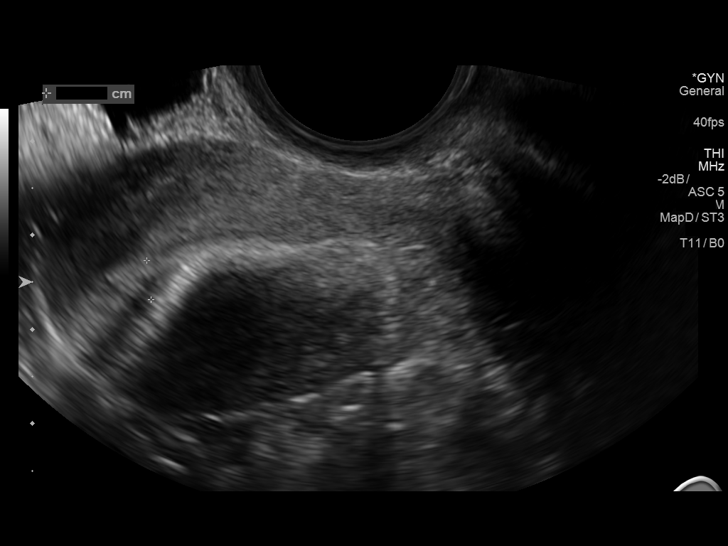
[im 48/104]
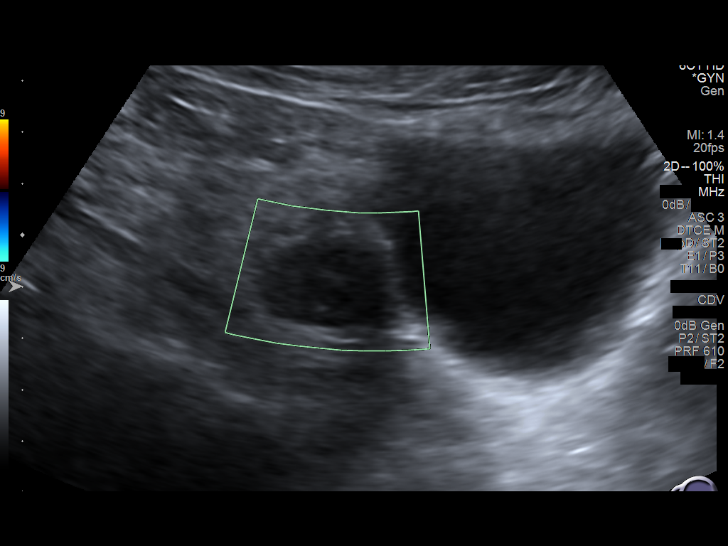
[im 56/104]
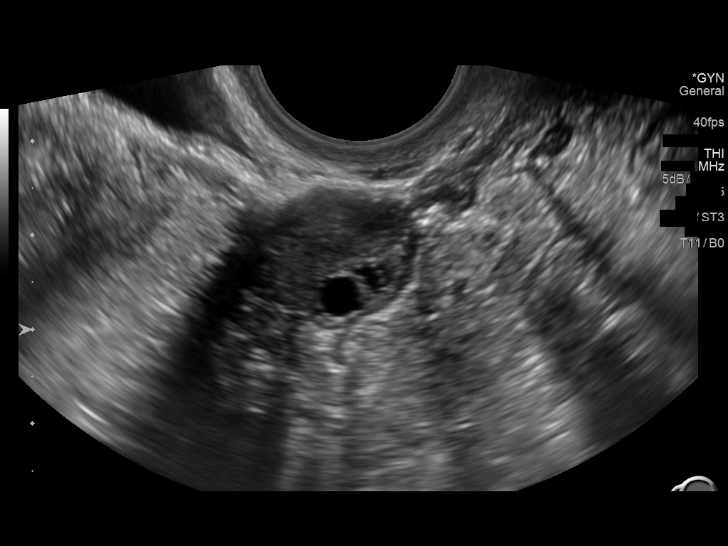
[im 65/104]
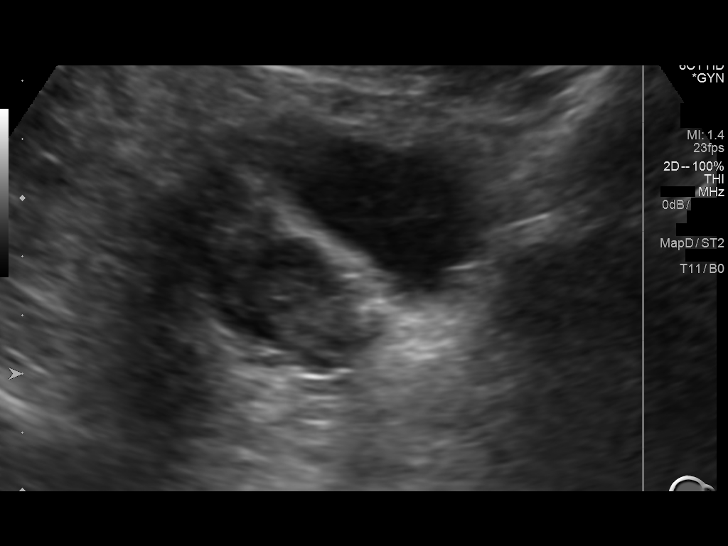
[im 69/104]
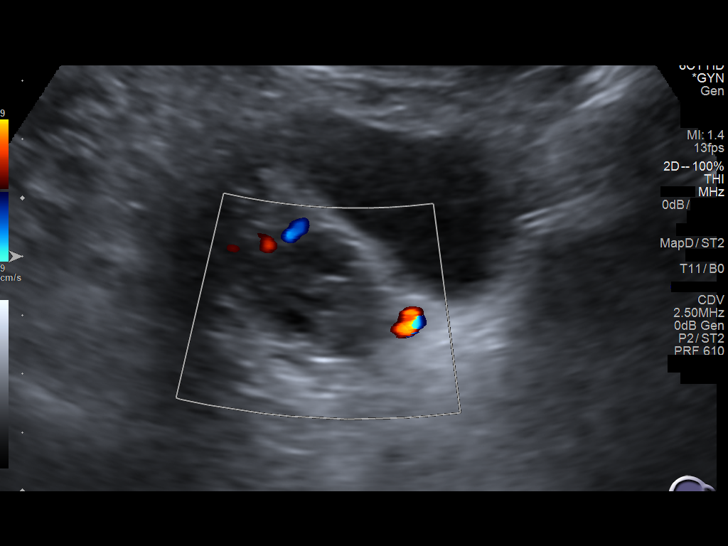
[im 78/104]
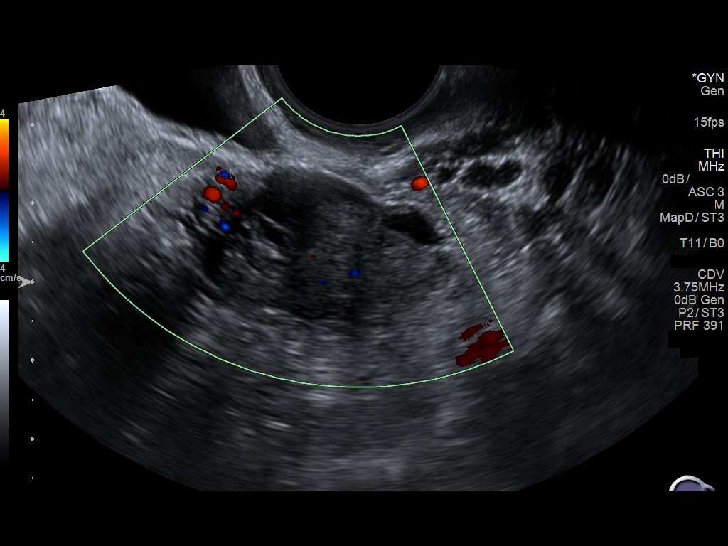
[im 86/104]
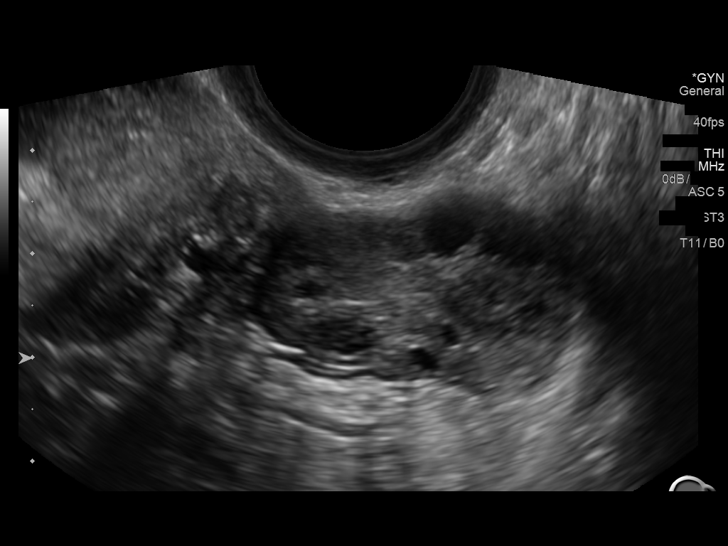
[im 95/104]
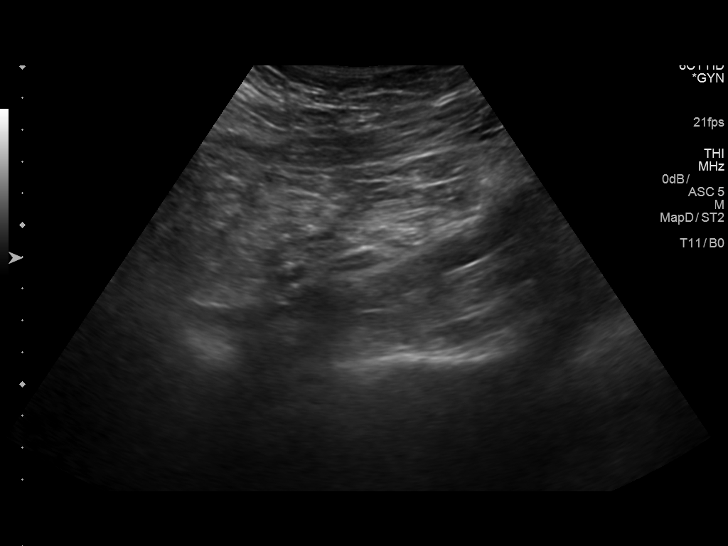
[im 104/104]
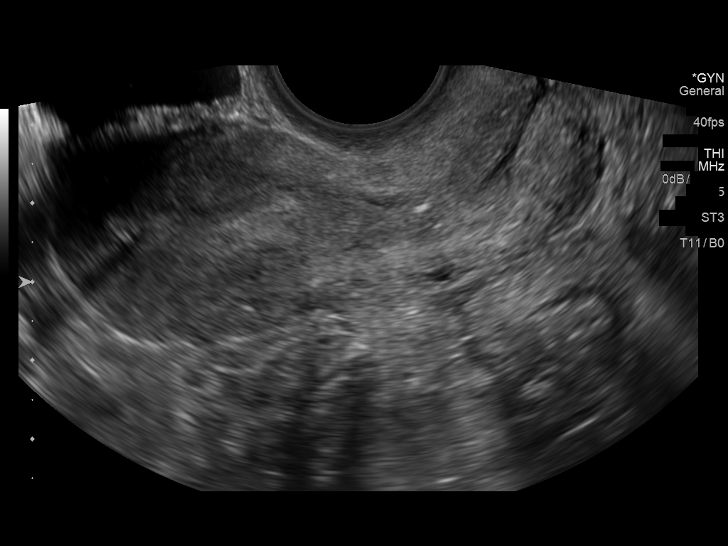

[14 of 25 positions shown; findings below may reference images not displayed]

FINDINGS: Uterus

Measurements: 7.1 x 2.8 x 3.2 cm.. No fibroids or other mass
visualized.

Endometrium

Thickness: 4 mm. Intrauterine device is positioned within the
endometrium. No focal abnormality visualized.

Right ovary

Measurements: 2.6 x 1.9 x 3.2 cm. Normal appearance/no adnexal mass.

Left ovary

Measurements: 3.2 x 1.9 x 2.9 cm.. There is a probable hemorrhagic
cyst in the left ovary measuring 1.4 x 1.3 x 1.5 cm. No other
extrauterine pelvic mass on the left.

Other findings

No abnormal free fluid.
IMPRESSION: 1. Probable hemorrhagic cyst left ovary measuring 1.4 x 1.3 x
cm. Short-interval follow up ultrasound in 6-12 weeks is
recommended, preferably during the week following the patient's
normal menses.

2. Intrauterine device within the endometrium. Endometrium is not
thickened.

3.  Study otherwise unremarkable.

## 2019-03-24 IMAGING — US US TRANSVAGINAL NON-OB
1 series · 14 of 25 positions shown · non-contrast
Comparison: 11/04/2017

CLINICAL DATA: Followup complex left ovarian cystic lesion.  IUD.

EXAM:
ULTRASOUND PELVIS TRANSVAGINAL
TECHNIQUE: Transvaginal ultrasound examination of the pelvis was performed
including evaluation of the uterus, ovaries, adnexal regions, and
pelvic cul-de-sac.

[Series 1: us transvaginal non-ob · 0.11mm/px · 14 of 56 slices shown]
[im 1/56]
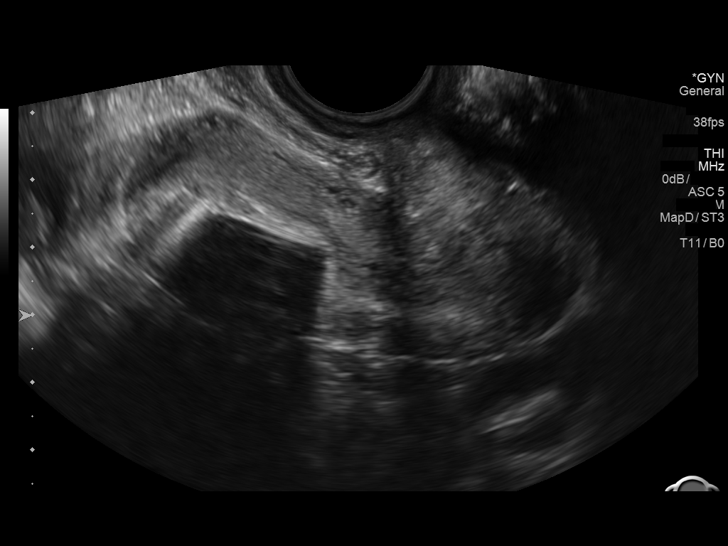
[im 5/56]
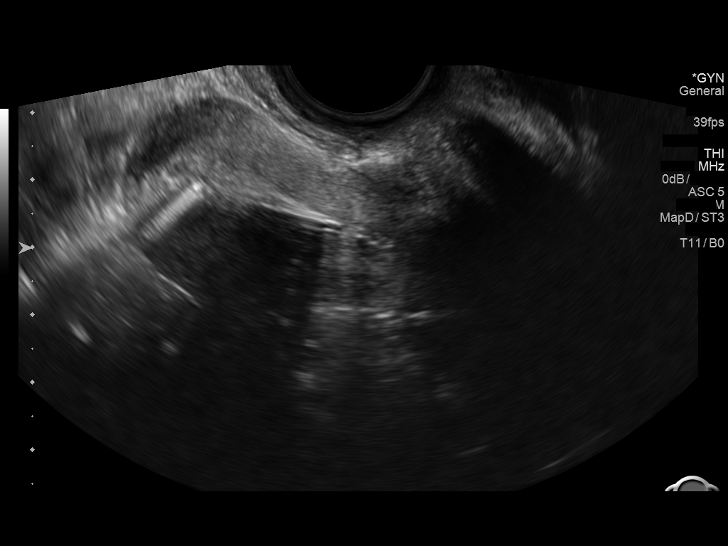
[im 10/56]
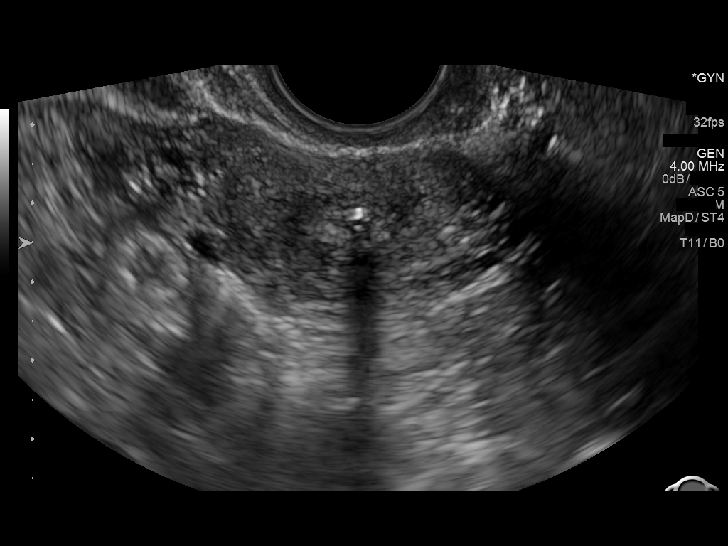
[im 14/56]
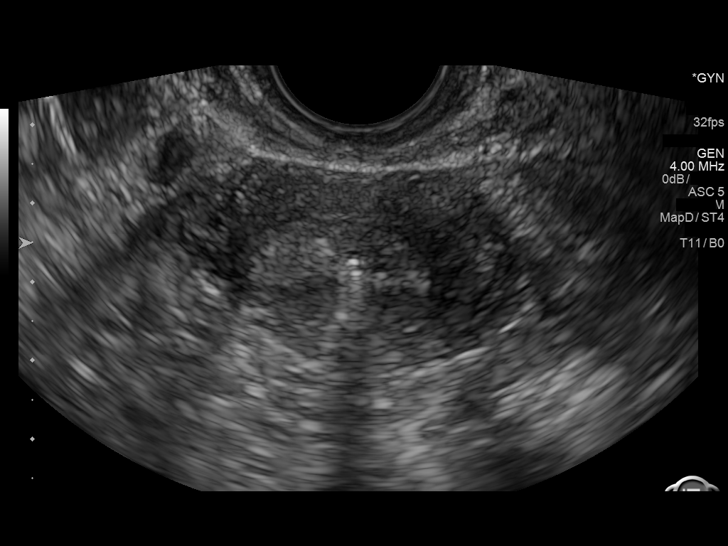
[im 19/56]
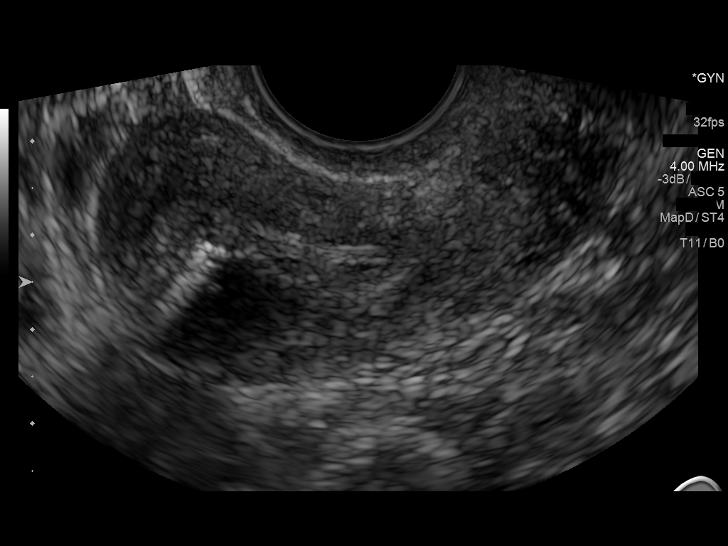
[im 21/56]
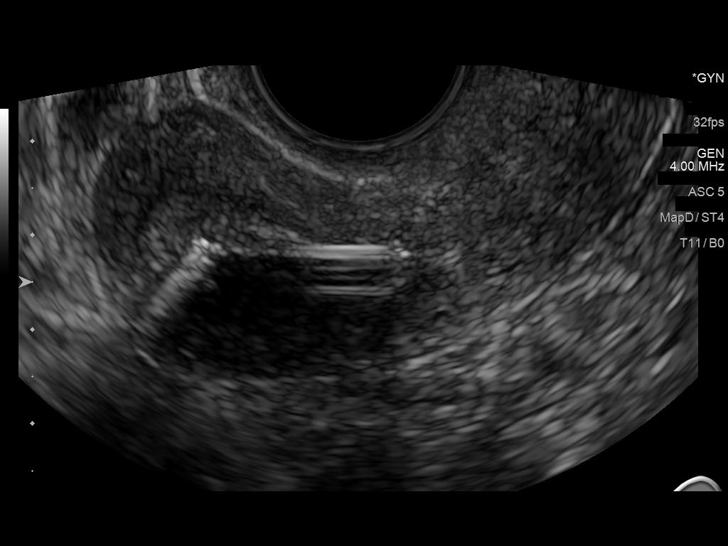
[im 26/56]
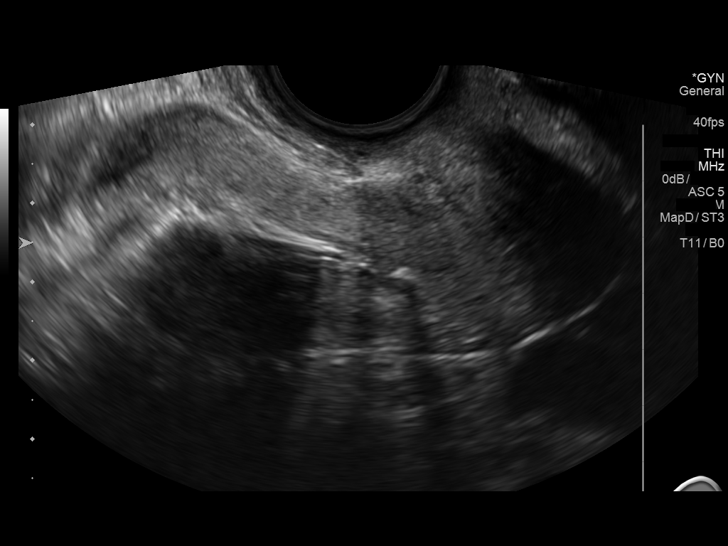
[im 30/56]
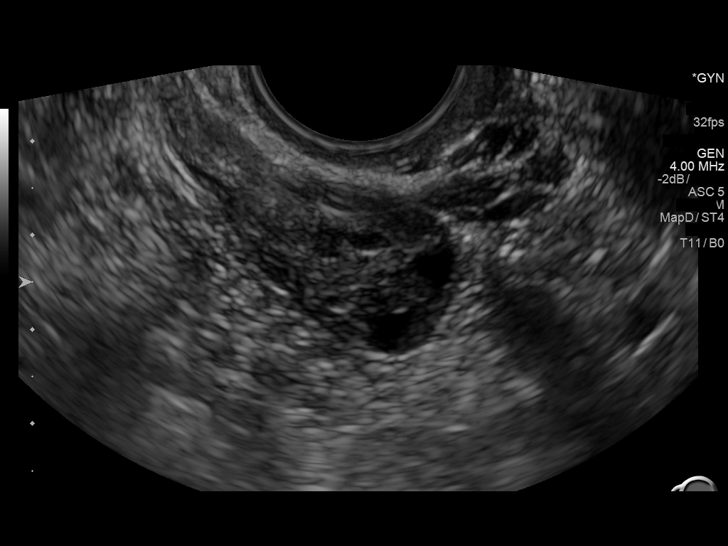
[im 35/56]
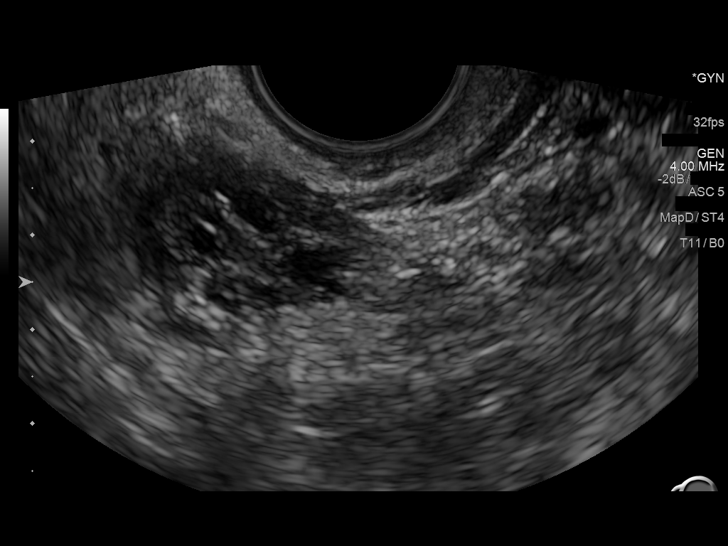
[im 37/56]
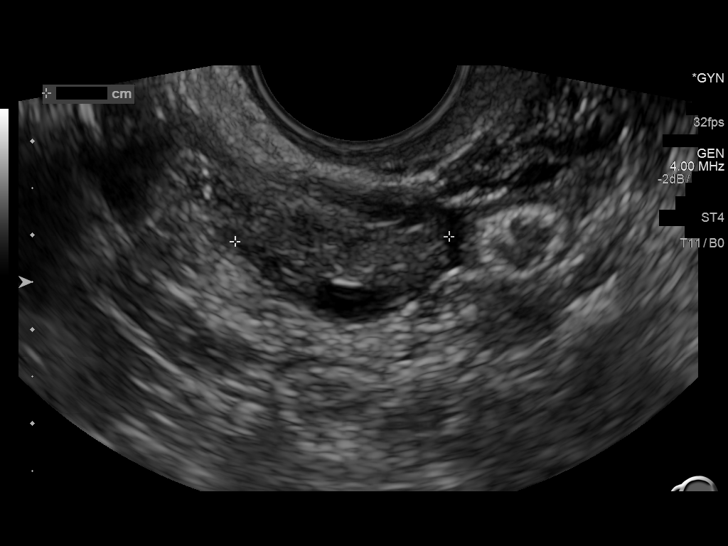
[im 42/56]
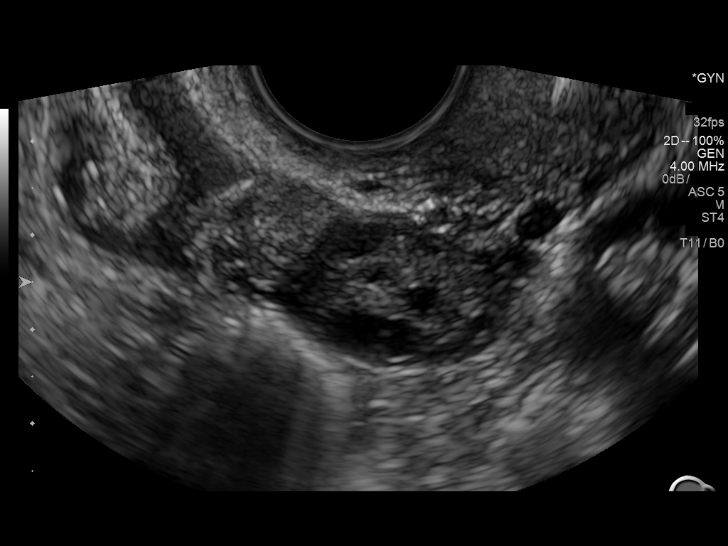
[im 46/56]
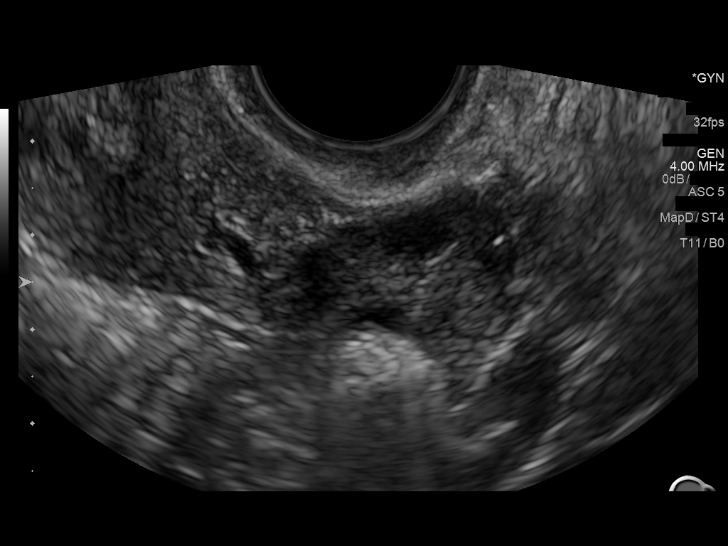
[im 51/56]
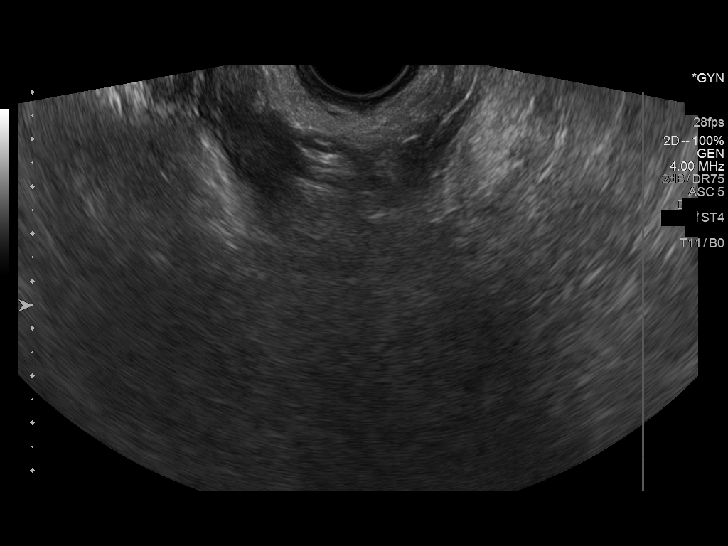
[im 56/56]
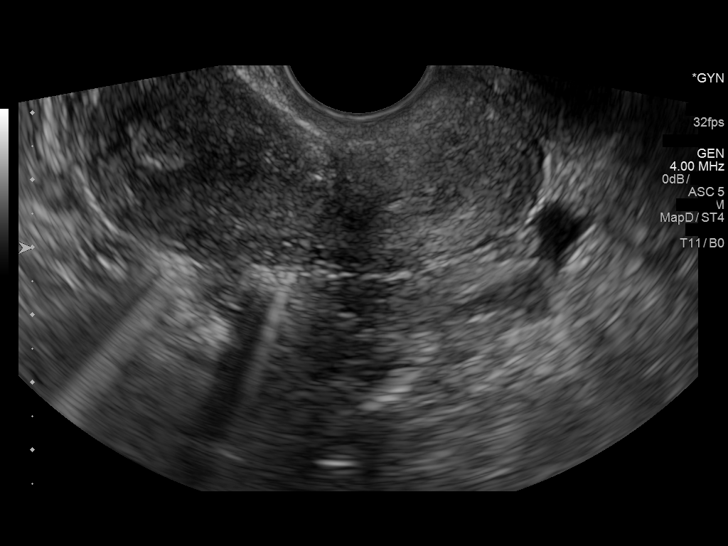

[14 of 25 positions shown; findings below may reference images not displayed]

FINDINGS: Uterus

Measurements: 6.9 x 2.8 x 3.9 cm. No fibroids or other mass
visualized.

Endometrium

Thickness: 9 mm. IUD seen in appropriate position in the endometrial
cavity.

Right ovary

Measurements: 2.6 x 1.6 x 2.3 cm. Normal appearance/no adnexal mass.

Left ovary

Measurements: 2.6 x 1.7 x 2.5 cm. Previously seen small corpus
luteum or hemorrhagic cyst is no longer visualized. Normal
appearance/no adnexal mass.

Other findings:  No abnormal free fluid
IMPRESSION: Normal appearance of uterus and both ovaries. Resolution of small
left ovarian hemorrhagic or corpus luteum cyst since prior study.

IUD in appropriate position in endometrial cavity.
